# Patient Record
Sex: Female | Born: 1970 | Race: White | Hispanic: No | Marital: Married | Smoking: Current every day smoker
Health system: Southern US, Community
[De-identification: ages and names within clinical notes are randomized; demographics above are authoritative.]

## PROBLEM LIST (undated history)

## (undated) DIAGNOSIS — C50919 Malignant neoplasm of unspecified site of unspecified female breast: Secondary | ICD-10-CM

## (undated) DIAGNOSIS — K52839 Microscopic colitis, unspecified: Secondary | ICD-10-CM

## (undated) DIAGNOSIS — Q2730 Arteriovenous malformation, site unspecified: Secondary | ICD-10-CM

## (undated) DIAGNOSIS — C801 Malignant (primary) neoplasm, unspecified: Secondary | ICD-10-CM

## (undated) HISTORY — PX: CHOLECYSTECTOMY: SHX55

---

## 2011-06-07 HISTORY — PX: CRANIOTOMY FOR AVM: SUR332

## 2011-11-15 DIAGNOSIS — I619 Nontraumatic intracerebral hemorrhage, unspecified: Secondary | ICD-10-CM | POA: Insufficient documentation

## 2011-12-15 DIAGNOSIS — H53469 Homonymous bilateral field defects, unspecified side: Secondary | ICD-10-CM | POA: Insufficient documentation

## 2011-12-15 DIAGNOSIS — Q283 Other malformations of cerebral vessels: Secondary | ICD-10-CM | POA: Insufficient documentation

## 2011-12-15 DIAGNOSIS — H53462 Homonymous bilateral field defects, left side: Secondary | ICD-10-CM | POA: Insufficient documentation

## 2019-09-19 DIAGNOSIS — H53462 Homonymous bilateral field defects, left side: Secondary | ICD-10-CM | POA: Diagnosis not present

## 2019-10-14 DIAGNOSIS — K219 Gastro-esophageal reflux disease without esophagitis: Secondary | ICD-10-CM | POA: Diagnosis not present

## 2019-10-14 DIAGNOSIS — F418 Other specified anxiety disorders: Secondary | ICD-10-CM | POA: Diagnosis not present

## 2019-10-14 DIAGNOSIS — K580 Irritable bowel syndrome with diarrhea: Secondary | ICD-10-CM | POA: Diagnosis not present

## 2019-10-14 DIAGNOSIS — L259 Unspecified contact dermatitis, unspecified cause: Secondary | ICD-10-CM | POA: Diagnosis not present

## 2019-10-14 DIAGNOSIS — F17200 Nicotine dependence, unspecified, uncomplicated: Secondary | ICD-10-CM | POA: Diagnosis not present

## 2019-10-14 DIAGNOSIS — J302 Other seasonal allergic rhinitis: Secondary | ICD-10-CM | POA: Diagnosis not present

## 2019-11-06 DIAGNOSIS — L570 Actinic keratosis: Secondary | ICD-10-CM | POA: Diagnosis not present

## 2019-11-06 DIAGNOSIS — Z85828 Personal history of other malignant neoplasm of skin: Secondary | ICD-10-CM | POA: Diagnosis not present

## 2019-11-06 DIAGNOSIS — L280 Lichen simplex chronicus: Secondary | ICD-10-CM | POA: Diagnosis not present

## 2020-06-19 DIAGNOSIS — K52839 Microscopic colitis, unspecified: Secondary | ICD-10-CM | POA: Diagnosis not present

## 2020-06-19 DIAGNOSIS — Z299 Encounter for prophylactic measures, unspecified: Secondary | ICD-10-CM | POA: Diagnosis not present

## 2020-06-19 DIAGNOSIS — Z6838 Body mass index (BMI) 38.0-38.9, adult: Secondary | ICD-10-CM | POA: Diagnosis not present

## 2020-06-19 DIAGNOSIS — F32A Depression, unspecified: Secondary | ICD-10-CM | POA: Diagnosis not present

## 2020-06-19 DIAGNOSIS — E559 Vitamin D deficiency, unspecified: Secondary | ICD-10-CM | POA: Diagnosis not present

## 2020-06-19 DIAGNOSIS — F17210 Nicotine dependence, cigarettes, uncomplicated: Secondary | ICD-10-CM | POA: Diagnosis not present

## 2020-06-19 DIAGNOSIS — Q282 Arteriovenous malformation of cerebral vessels: Secondary | ICD-10-CM | POA: Diagnosis not present

## 2020-07-27 DIAGNOSIS — Z299 Encounter for prophylactic measures, unspecified: Secondary | ICD-10-CM | POA: Diagnosis not present

## 2020-07-27 DIAGNOSIS — Z1331 Encounter for screening for depression: Secondary | ICD-10-CM | POA: Diagnosis not present

## 2020-07-27 DIAGNOSIS — K649 Unspecified hemorrhoids: Secondary | ICD-10-CM | POA: Diagnosis not present

## 2020-07-27 DIAGNOSIS — Q282 Arteriovenous malformation of cerebral vessels: Secondary | ICD-10-CM | POA: Diagnosis not present

## 2020-07-27 DIAGNOSIS — Z1339 Encounter for screening examination for other mental health and behavioral disorders: Secondary | ICD-10-CM | POA: Diagnosis not present

## 2020-07-27 DIAGNOSIS — Z0000 Encounter for general adult medical examination without abnormal findings: Secondary | ICD-10-CM | POA: Diagnosis not present

## 2020-07-27 DIAGNOSIS — Z7189 Other specified counseling: Secondary | ICD-10-CM | POA: Diagnosis not present

## 2020-09-03 ENCOUNTER — Other Ambulatory Visit: Payer: Self-pay | Admitting: Internal Medicine

## 2020-09-03 DIAGNOSIS — Z1231 Encounter for screening mammogram for malignant neoplasm of breast: Secondary | ICD-10-CM

## 2020-09-07 ENCOUNTER — Ambulatory Visit
Admission: RE | Admit: 2020-09-07 | Discharge: 2020-09-07 | Disposition: A | Discharge status: Home / Self Care | Payer: Medicare HMO | Source: Ambulatory Visit | Attending: Internal Medicine | Admitting: Internal Medicine

## 2020-09-07 ENCOUNTER — Other Ambulatory Visit: Payer: Self-pay

## 2020-09-07 DIAGNOSIS — Z1231 Encounter for screening mammogram for malignant neoplasm of breast: Secondary | ICD-10-CM | POA: Diagnosis not present

## 2020-09-24 DIAGNOSIS — J329 Chronic sinusitis, unspecified: Secondary | ICD-10-CM | POA: Diagnosis not present

## 2020-09-24 DIAGNOSIS — F32A Depression, unspecified: Secondary | ICD-10-CM | POA: Diagnosis not present

## 2020-09-24 DIAGNOSIS — Z299 Encounter for prophylactic measures, unspecified: Secondary | ICD-10-CM | POA: Diagnosis not present

## 2021-01-25 DIAGNOSIS — Z6838 Body mass index (BMI) 38.0-38.9, adult: Secondary | ICD-10-CM | POA: Diagnosis not present

## 2021-01-25 DIAGNOSIS — K649 Unspecified hemorrhoids: Secondary | ICD-10-CM | POA: Diagnosis not present

## 2021-01-25 DIAGNOSIS — F17210 Nicotine dependence, cigarettes, uncomplicated: Secondary | ICD-10-CM | POA: Diagnosis not present

## 2021-01-25 DIAGNOSIS — E559 Vitamin D deficiency, unspecified: Secondary | ICD-10-CM | POA: Diagnosis not present

## 2021-01-25 DIAGNOSIS — F32A Depression, unspecified: Secondary | ICD-10-CM | POA: Diagnosis not present

## 2021-01-25 DIAGNOSIS — Z299 Encounter for prophylactic measures, unspecified: Secondary | ICD-10-CM | POA: Diagnosis not present

## 2021-01-25 DIAGNOSIS — Z789 Other specified health status: Secondary | ICD-10-CM | POA: Diagnosis not present

## 2021-01-25 DIAGNOSIS — E538 Deficiency of other specified B group vitamins: Secondary | ICD-10-CM | POA: Diagnosis not present

## 2021-07-28 DIAGNOSIS — F17210 Nicotine dependence, cigarettes, uncomplicated: Secondary | ICD-10-CM | POA: Diagnosis not present

## 2021-07-28 DIAGNOSIS — Z6837 Body mass index (BMI) 37.0-37.9, adult: Secondary | ICD-10-CM | POA: Diagnosis not present

## 2021-07-28 DIAGNOSIS — E2839 Other primary ovarian failure: Secondary | ICD-10-CM | POA: Diagnosis not present

## 2021-07-28 DIAGNOSIS — Z0000 Encounter for general adult medical examination without abnormal findings: Secondary | ICD-10-CM | POA: Diagnosis not present

## 2021-07-28 DIAGNOSIS — Z1331 Encounter for screening for depression: Secondary | ICD-10-CM | POA: Diagnosis not present

## 2021-07-28 DIAGNOSIS — Z299 Encounter for prophylactic measures, unspecified: Secondary | ICD-10-CM | POA: Diagnosis not present

## 2021-07-28 DIAGNOSIS — F32A Depression, unspecified: Secondary | ICD-10-CM | POA: Diagnosis not present

## 2021-07-28 DIAGNOSIS — Z79899 Other long term (current) drug therapy: Secondary | ICD-10-CM | POA: Diagnosis not present

## 2021-07-28 DIAGNOSIS — Z1339 Encounter for screening examination for other mental health and behavioral disorders: Secondary | ICD-10-CM | POA: Diagnosis not present

## 2021-07-28 DIAGNOSIS — Z7189 Other specified counseling: Secondary | ICD-10-CM | POA: Diagnosis not present

## 2021-07-28 DIAGNOSIS — E559 Vitamin D deficiency, unspecified: Secondary | ICD-10-CM | POA: Diagnosis not present

## 2021-08-06 ENCOUNTER — Other Ambulatory Visit: Payer: Self-pay | Admitting: Internal Medicine

## 2021-08-06 DIAGNOSIS — E2839 Other primary ovarian failure: Secondary | ICD-10-CM | POA: Diagnosis not present

## 2021-08-06 DIAGNOSIS — Z139 Encounter for screening, unspecified: Secondary | ICD-10-CM

## 2021-08-18 ENCOUNTER — Inpatient Hospital Stay: Admission: RE | Admit: 2021-08-18 | Payer: Medicare HMO | Source: Ambulatory Visit

## 2021-09-15 ENCOUNTER — Ambulatory Visit
Admission: RE | Admit: 2021-09-15 | Discharge: 2021-09-15 | Disposition: A | Discharge status: Home / Self Care | Payer: Medicare HMO | Source: Ambulatory Visit | Attending: Internal Medicine | Admitting: Internal Medicine

## 2021-09-15 DIAGNOSIS — Z1231 Encounter for screening mammogram for malignant neoplasm of breast: Secondary | ICD-10-CM | POA: Diagnosis not present

## 2021-09-15 DIAGNOSIS — Z139 Encounter for screening, unspecified: Secondary | ICD-10-CM

## 2021-09-17 ENCOUNTER — Other Ambulatory Visit: Payer: Self-pay | Admitting: Internal Medicine

## 2021-09-17 DIAGNOSIS — R928 Other abnormal and inconclusive findings on diagnostic imaging of breast: Secondary | ICD-10-CM

## 2021-10-20 DIAGNOSIS — N6311 Unspecified lump in the right breast, upper outer quadrant: Secondary | ICD-10-CM | POA: Diagnosis not present

## 2021-10-20 DIAGNOSIS — R928 Other abnormal and inconclusive findings on diagnostic imaging of breast: Secondary | ICD-10-CM | POA: Diagnosis not present

## 2021-10-20 DIAGNOSIS — R921 Mammographic calcification found on diagnostic imaging of breast: Secondary | ICD-10-CM | POA: Diagnosis not present

## 2021-10-20 DIAGNOSIS — D0511 Intraductal carcinoma in situ of right breast: Secondary | ICD-10-CM | POA: Diagnosis not present

## 2021-11-02 DIAGNOSIS — F32A Depression, unspecified: Secondary | ICD-10-CM | POA: Diagnosis not present

## 2021-11-02 DIAGNOSIS — L26 Exfoliative dermatitis: Secondary | ICD-10-CM | POA: Diagnosis not present

## 2021-11-02 DIAGNOSIS — L259 Unspecified contact dermatitis, unspecified cause: Secondary | ICD-10-CM | POA: Diagnosis not present

## 2021-11-02 DIAGNOSIS — F17210 Nicotine dependence, cigarettes, uncomplicated: Secondary | ICD-10-CM | POA: Diagnosis not present

## 2021-11-02 DIAGNOSIS — R21 Rash and other nonspecific skin eruption: Secondary | ICD-10-CM | POA: Diagnosis not present

## 2021-11-02 DIAGNOSIS — Z299 Encounter for prophylactic measures, unspecified: Secondary | ICD-10-CM | POA: Diagnosis not present

## 2021-11-10 ENCOUNTER — Inpatient Hospital Stay (HOSPITAL_COMMUNITY): Payer: Medicare HMO | Attending: Hematology | Admitting: Hematology

## 2021-11-10 DIAGNOSIS — F17210 Nicotine dependence, cigarettes, uncomplicated: Secondary | ICD-10-CM | POA: Insufficient documentation

## 2021-11-10 DIAGNOSIS — D0511 Intraductal carcinoma in situ of right breast: Secondary | ICD-10-CM | POA: Insufficient documentation

## 2021-11-10 DIAGNOSIS — Z79899 Other long term (current) drug therapy: Secondary | ICD-10-CM | POA: Insufficient documentation

## 2021-11-10 DIAGNOSIS — Z1211 Encounter for screening for malignant neoplasm of colon: Secondary | ICD-10-CM | POA: Diagnosis not present

## 2021-11-10 DIAGNOSIS — R634 Abnormal weight loss: Secondary | ICD-10-CM | POA: Insufficient documentation

## 2021-11-10 DIAGNOSIS — Z1212 Encounter for screening for malignant neoplasm of rectum: Secondary | ICD-10-CM | POA: Diagnosis not present

## 2021-11-10 DIAGNOSIS — Z800 Family history of malignant neoplasm of digestive organs: Secondary | ICD-10-CM | POA: Insufficient documentation

## 2021-11-10 DIAGNOSIS — Z803 Family history of malignant neoplasm of breast: Secondary | ICD-10-CM | POA: Diagnosis not present

## 2021-11-10 DIAGNOSIS — E669 Obesity, unspecified: Secondary | ICD-10-CM | POA: Insufficient documentation

## 2021-11-10 DIAGNOSIS — F32A Depression, unspecified: Secondary | ICD-10-CM | POA: Insufficient documentation

## 2021-11-10 NOTE — Progress Notes (Signed)
[facility] [address], [place] [address]   Patient Care Team: Monico Blitz, MD as PCP - General (Internal Medicine) Brien Mates, RN as Oncology Nurse Navigator (Medical Oncology) Derek Jack, MD as Medical Oncologist (Medical Oncology)  CHIEF COMPLAINTS/PURPOSE OF CONSULTATION:  Newly diagnosed right breast DCIS  HISTORY OF PRESENTING ILLNESS:  Katrina Harvey 51 y.o. female is here because of recent diagnosis of right breast DCIS.  Today she reports feeling good. She denies history of breast biopsies. She reports she has menses every 2-3 months starting last year; prior to that they occurred regularly. She denies unintentional weight loss, fevers, and night sweats. She reports she has intentionally losing weight via exercise. She is taking 2000 units of vitamin D daily.   She is not currently working, but previously she worked as a Occupational psychologist. She denies chemical exposure. She currently smokes 1 ppd and she has smoked for about 30 years. Her maternal uncle had cancer of an unknown type, a maternal cousin has lung cancer, and another maternal cousin had breast cancer.   In terms of breast cancer risk profile:  She menarched at early age of 64 and her last menses was 5/31-6/6  She had 2 pregnancies, her first child was born at age 52  She has received birth control pills for approximately 15 years.  She has family history of Breast/GYN/GI cancer  I reviewed her records extensively and collaborated the history with the patient.  SUMMARY OF ONCOLOGIC HISTORY: Oncology History   No history exists.    MEDICAL HISTORY:  No past medical history on file.  SURGICAL HISTORY: No past surgical history on file.  SOCIAL HISTORY: Social History   Socioeconomic History   Marital status: Married    Spouse name: Not on file   Number of children: Not on file   Years of education: Not on file   Highest education level: Not on file  Occupational  History   Not on file  Tobacco Use   Smoking status: Not on file   Smokeless tobacco: Not on file  Substance and Sexual Activity   Alcohol use: Not on file   Drug use: Not on file   Sexual activity: Not on file  Other Topics Concern   Not on file  Social History Narrative   Not on file   Social Determinants of Health   Financial Resource Strain: Not on file  Food Insecurity: Not on file  Transportation Needs: Not on file  Physical Activity: Not on file  Stress: Not on file  Social Connections: Not on file  Intimate Partner Violence: Not on file    FAMILY HISTORY: Family History  Problem Relation Age of Onset   Breast cancer Neg Hx     ALLERGIES:  has no allergies on file.  MEDICATIONS:  Current Outpatient Medications  Medication Sig Dispense Refill   escitalopram (LEXAPRO) 10 MG tablet Take 10 mg by mouth daily.     No current facility-administered medications for this visit.    REVIEW OF SYSTEMS:   Review of Systems  Constitutional:  Negative for appetite change, fatigue, fever and unexpected weight change.  Endocrine: Negative for hot flashes.  Psychiatric/Behavioral:  Positive for depression.   All other systems reviewed and are negative.  PHYSICAL EXAMINATION: ECOG PERFORMANCE STATUS: 0 - Asymptomatic  Vitals:   11/10/21 1316  BP: 130/87  Pulse: (!) 56  Resp: 18  Temp: 97.6 F (36.4 C)  SpO2: 96%   Filed  Weights   11/10/21 1316  Weight: 201 lb 4.5 oz (91.3 kg)   Physical Exam Vitals reviewed.  Constitutional:      Appearance: Normal appearance. She is obese.  Cardiovascular:     Rate and Rhythm: Normal rate and regular rhythm.     Pulses: Normal pulses.     Heart sounds: Normal heart sounds.  Pulmonary:     Effort: Pulmonary effort is normal.     Breath sounds: Normal breath sounds.  Chest:  Breasts:    Right: No swelling, bleeding, inverted nipple, mass, nipple discharge, skin change or tenderness.     Left: No swelling, bleeding,  inverted nipple, mass, nipple discharge, skin change or tenderness.  Abdominal:     Palpations: Abdomen is soft. There is no hepatomegaly, splenomegaly or mass.     Tenderness: There is no abdominal tenderness.  Musculoskeletal:     Right lower leg: No edema.     Left lower leg: No edema.  Lymphadenopathy:     Upper Body:     Right upper body: No supraclavicular or axillary adenopathy.     Left upper body: No supraclavicular or axillary adenopathy.     Lower Body: No right inguinal adenopathy. No left inguinal adenopathy.  Neurological:     General: No focal deficit present.     Mental Status: She is alert and oriented to person, place, and time.  Psychiatric:        Mood and Affect: Mood normal.        Behavior: Behavior normal.    Breast Exam Chaperone: Thana Ates    LABORATORY DATA:  I have reviewed the data as listed No results found for this or any previous visit (from the past 2160 hour(s)).  RADIOGRAPHIC STUDIES: I have personally reviewed the radiological reports and agreed with the findings in the report. No results found.   ASSESSMENT:  Right breast DCIS, ER positive: - Mammogram (09/16/2021): BI-RADS Category 0 with possible asymmetry with calcifications in the right breast - Diagnostic mammogram and ultrasound (10/20/2021): 1.7 cm mass with associated calcifications in the right breast at 11 o'clock position - Biopsy right breast 11:00 (10/20/2021): DCIS, moderate to high nuclear grade, solid to cribriform pattern, with comedonecrosis and calcification.  Tumor involves/4 biopsy specimens.  Invasive carcinoma not identified.  ER: 70% positive, PR 2% positive   Social/family history: - She is currently on disability.  She used to work as a Occupational psychologist.  Current active smoker, 1 pack/day for 30 years.  No chemical exposure. - Maternal uncle had cancer, type unknown to the patient.  Maternal cousin has lung cancer and another maternal cousin has breast cancer. - LMP  on 11/03/2021.  Menses have gotten irregular once every 2 to 3 months since 2022.   PLAN:  Right breast DCIS, ER positive: - We have reviewed imaging studies and pathology report in detail. - I have recommended lumpectomy.  I have discussed the role of radiation therapy and antiestrogen therapy with tamoxifen. - I will see her back 4 weeks after surgical resection.  2.  Bone health: - She was on weekly high-dose vitamin D until 2 months ago and is currently taking vitamin D 2000 units daily. - We will obtain DEXA scan reports from Dr. Trena Platt office. - We will also check vitamin D level.    Derek Jack, MD 11/10/21 2:13 PM  [facility] [phone]   I, Thana Ates, am acting as a scribe for Dr. Derek Jack.  I,  Derek Jack MD, have reviewed the above documentation for accuracy and completeness, and I agree with the above.

## 2021-11-10 NOTE — Patient Instructions (Addendum)
[place] at [facility] Discharge Instructions  You were seen and examined today by Dr. Delton Coombes. Dr. Delton Coombes is a medical oncologist, meaning that he specializes in the treatment of cancer diagnoses. Dr. Delton Coombes discussed your past medical history, family history of cancers, and the events that led to you being here today.  You were referred to Dr. Delton Coombes by [facility] due to an abnormality on your recent mammogram leading to a biopsy.  The biopsy results revealed estrogen and progesterone receptor positive ductal carcinoma in situ (DCIS). This is a pre-cancerous condition that is being fed by the hormones that occur naturally within your body.  Please see a surgeon regarding a lumpectomy. You will be referred to see a Psychologist, sport and exercise.  Following surgery, the goal will be to prevent DCIS from returning, or from returning as invasive breast cancer. This is done with radiation as well as a daily anti-estrogen pill to be taken for at least 5 years.  Because of the need for anti-estrogen pills, Dr. Delton Coombes has recommended a bone density scan and checking your vitamin D.  Follow-up with Dr. Delton Coombes approximately 4 weeks after surgery.   Thank you for choosing [place] at [facility] to provide your oncology and hematology care.  To afford each patient quality time with our provider, please arrive at least 15 minutes before your scheduled appointment time.   If you have a lab appointment with the [facility] please come in thru the Main Entrance and check in at the main information desk.  You need to re-schedule your appointment should you arrive 10 or more minutes late.  We strive to give you quality time with our providers, and arriving late affects you and other patients whose appointments are after yours.  Also, if you no show three or more times for appointments you may be dismissed from the clinic at the providers discretion.      Again, thank you for choosing [facility].  Our hope is that these requests will decrease the amount of time that you wait before being seen by our physicians.       _____________________________________________________________  Should you have questions after your visit to [facility], please contact our office at [phone] and follow the prompts.  Our office hours are 8:00 a.m. and 4:30 p.m. Monday - Friday.  Please note that voicemails left after 4:00 p.m. may not be returned until the following business day.  We are closed weekends and major holidays.  You do have access to a nurse 24-7, just call the main number to the clinic [phone] and do not press any options, hold on the line and a nurse will answer the phone.    For prescription refill requests, have your pharmacy contact our office and allow 72 hours.    Due to Covid, you will need to wear a mask upon entering the hospital. If you do not have a mask, a mask will be given to you at the Main Entrance upon arrival. For doctor visits, patients may have 1 support person age 55 or older with them. For treatment visits, patients can not have anyone with them due to social distancing guidelines and our immunocompromised population.

## 2021-11-18 LAB — COLOGUARD: COLOGUARD: NEGATIVE

## 2021-11-19 DIAGNOSIS — F17210 Nicotine dependence, cigarettes, uncomplicated: Secondary | ICD-10-CM | POA: Diagnosis not present

## 2021-11-19 DIAGNOSIS — L237 Allergic contact dermatitis due to plants, except food: Secondary | ICD-10-CM | POA: Diagnosis not present

## 2021-11-19 DIAGNOSIS — C50911 Malignant neoplasm of unspecified site of right female breast: Secondary | ICD-10-CM | POA: Diagnosis not present

## 2021-11-19 DIAGNOSIS — Z299 Encounter for prophylactic measures, unspecified: Secondary | ICD-10-CM | POA: Diagnosis not present

## 2021-11-25 ENCOUNTER — Ambulatory Visit: Payer: Medicare HMO | Admitting: General Surgery

## 2021-11-25 ENCOUNTER — Encounter: Payer: Self-pay | Admitting: General Surgery

## 2021-11-25 VITALS — BP 116/75 | HR 63 | Temp 98.1°F | Resp 12 | Ht 63.0 in | Wt 201.0 lb

## 2021-11-25 DIAGNOSIS — D0511 Intraductal carcinoma in situ of right breast: Secondary | ICD-10-CM | POA: Diagnosis not present

## 2021-11-25 NOTE — Progress Notes (Signed)
Katrina Harvey; [id number]; 06/27/1970   HPI Patient is a 51-year-old white female who was referred to my care by Dr. Katragadda of oncology for evaluation and treatment of DCIS of the right breast.  This was found on routine screening mammography.  This is biopsy-proven to be DCIS with ER/PR positivity.  Patient did not feel a lump.  She has had no bloody nipple discharge.  There is no family history of breast cancer. History reviewed. No pertinent past medical history.  History reviewed. No pertinent surgical history.  Family History  Problem Relation Age of Onset   Breast cancer Neg Hx     Current Outpatient Medications on File Prior to Visit  Medication Sig Dispense Refill   Cholecalciferol (VITAMIN D) 50 MCG (2000 UT) tablet Take 2,000 Units by mouth daily.     escitalopram (LEXAPRO) 10 MG tablet Take 10 mg by mouth daily.     No current facility-administered medications on file prior to visit.    No Known Allergies  Social History   Substance and Sexual Activity  Alcohol Use Not Currently    Social History   Tobacco Use  Smoking Status Every Day   Types: Cigarettes  Smokeless Tobacco Never    Review of Systems  HENT: Negative.    Eyes:  Positive for blurred vision.  Respiratory: Negative.    Cardiovascular: Negative.   Gastrointestinal: Negative.   Genitourinary: Negative.   Musculoskeletal:  Positive for back pain and joint pain.  Skin:  Positive for rash.  Neurological: Negative.   Endo/Heme/Allergies: Negative.   Psychiatric/Behavioral: Negative.      Objective   Vitals:   11/25/21 0905  BP: 116/75  Pulse: 63  Resp: 12  Temp: 98.1 F (36.7 C)  SpO2: 96%    Physical Exam Vitals reviewed. Exam conducted with a chaperone present.  Constitutional:      Appearance: Normal appearance. She is not ill-appearing.  HENT:     Head: Normocephalic and atraumatic.  Cardiovascular:     Rate and Rhythm: Normal rate and regular rhythm.     Heart sounds:  Normal heart sounds. No murmur heard.    No friction rub. No gallop.  Pulmonary:     Effort: Pulmonary effort is normal. No respiratory distress.     Breath sounds: Normal breath sounds. No stridor. No wheezing, rhonchi or rales.  Musculoskeletal:     Cervical back: Normal range of motion and neck supple.  Lymphadenopathy:     Cervical: No cervical adenopathy.  Skin:    General: Skin is warm and dry.  Neurological:     Mental Status: She is alert and oriented to person, place, and time.   Breast: An indistinct area of increased density is noted in the upper, outer quadrant of the right breast.  No ecchymosis present.  No nipple discharge or dimpling is noted.  The axilla is negative for palpable nodes.  Left breast examination reveals no dominant mass, nipple discharge, or dimpling.  The axilla is negative for palpable nodes.  Dr. Katragadda's notes reviewed.  Mammogram report reviewed.  Assessment  DCIS of right breast, 1.7 cm in greatest diameter.  ER/PR positive. Plan  Patient has already decided to undergo breast conservative therapy.  She understands that she will need radiation therapy after the procedure.  We will proceed with right partial mastectomy after radiofrequency tag placement, sentinel lymph node biopsy.  The risks and benefits of the procedures including bleeding, infection, and incomplete margins were fully explained to the patient,   who gave informed consent.  We are arranging scheduling of the procedure. 

## 2021-11-29 ENCOUNTER — Other Ambulatory Visit (HOSPITAL_COMMUNITY): Payer: Self-pay | Admitting: General Surgery

## 2021-11-29 DIAGNOSIS — D0511 Intraductal carcinoma in situ of right breast: Secondary | ICD-10-CM

## 2021-11-29 DIAGNOSIS — D0512 Intraductal carcinoma in situ of left breast: Secondary | ICD-10-CM

## 2021-12-02 ENCOUNTER — Inpatient Hospital Stay
Admission: RE | Admit: 2021-12-02 | Discharge: 2021-12-02 | Disposition: A | Discharge status: Home / Self Care | Payer: Self-pay | Source: Ambulatory Visit | Attending: *Deleted | Admitting: *Deleted

## 2021-12-02 ENCOUNTER — Other Ambulatory Visit: Payer: Self-pay | Admitting: *Deleted

## 2021-12-02 DIAGNOSIS — Z1231 Encounter for screening mammogram for malignant neoplasm of breast: Secondary | ICD-10-CM

## 2021-12-08 NOTE — H&P (Signed)
Katrina Harvey; [id number]; 07-05-1970   HPI Patient is a 51 year old white female who was referred to my care by Dr. 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  This was found on routine screening mammography.  This is biopsy-proven to be DCIS with ER/PR positivity.  Patient did not feel a lump.  She has had no bloody nipple discharge.  There is no family history of breast cancer. History reviewed. No pertinent past medical history.  History reviewed. No pertinent surgical history.  Family History  Problem Relation Age of Onset   Breast cancer Neg Hx     Current Outpatient Medications on File Prior to Visit  Medication Sig Dispense Refill   Cholecalciferol (VITAMIN D) 50 MCG (2000 UT) tablet Take 2,000 Units by mouth daily.     escitalopram (LEXAPRO) 10 MG tablet Take 10 mg by mouth daily.     No current facility-administered medications on file prior to visit.    No Known Allergies  Social History   Substance and Sexual Activity  Alcohol Use Not Currently    Social History   Tobacco Use  Smoking Status Every Day   Types: Cigarettes  Smokeless Tobacco Never    Review of Systems  HENT: Negative.    Eyes:  Positive for blurred vision.  Respiratory: Negative.    Cardiovascular: Negative.   Gastrointestinal: Negative.   Genitourinary: Negative.   Musculoskeletal:  Positive for back pain and joint pain.  Skin:  Positive for rash.  Neurological: Negative.   Endo/Heme/Allergies: Negative.   Psychiatric/Behavioral: Negative.      Objective   Vitals:   11/25/21 0905  BP: 116/75  Pulse: 63  Resp: 12  Temp: 98.1 F (36.7 C)  SpO2: 96%    Physical Exam Vitals reviewed. Exam conducted with a chaperone present.  Constitutional:      Appearance: Normal appearance. She is not ill-appearing.  HENT:     Head: Normocephalic and atraumatic.  Cardiovascular:     Rate and Rhythm: Normal rate and regular rhythm.     Heart sounds:  Normal heart sounds. No murmur heard.    No friction rub. No gallop.  Pulmonary:     Effort: Pulmonary effort is normal. No respiratory distress.     Breath sounds: Normal breath sounds. No stridor. No wheezing, rhonchi or rales.  Musculoskeletal:     Cervical back: Normal range of motion and neck supple.  Lymphadenopathy:     Cervical: No cervical adenopathy.  Skin:    General: Skin is warm and dry.  Neurological:     Mental Status: She is alert and oriented to person, place, and time.   Breast: An indistinct area of increased density is noted in the upper, outer quadrant of the right breast.  No ecchymosis present.  No nipple discharge or dimpling is noted.  The axilla is negative for palpable nodes.  Left breast examination reveals no dominant mass, nipple discharge, or dimpling.  The axilla is negative for palpable nodes.  Dr. Tomie China notes reviewed.  Mammogram report reviewed.  Assessment  DCIS of right breast, 1.7 cm in greatest diameter.  ER/PR positive. Plan  Patient has already decided to undergo breast conservative therapy.  She understands that she will need radiation therapy after the procedure.  We will proceed with right partial mastectomy after radiofrequency tag placement, sentinel lymph node biopsy.  The risks and benefits of the procedures including bleeding, infection, and incomplete margins were fully explained to the patient,  who gave informed consent.  We are arranging scheduling of the procedure.

## 2021-12-14 ENCOUNTER — Other Ambulatory Visit (HOSPITAL_COMMUNITY): Payer: Self-pay | Admitting: General Surgery

## 2021-12-14 ENCOUNTER — Ambulatory Visit (HOSPITAL_COMMUNITY)
Admission: RE | Admit: 2021-12-14 | Discharge: 2021-12-14 | Disposition: A | Discharge status: Home / Self Care | Payer: Medicare HMO | Source: Ambulatory Visit | Attending: General Surgery | Admitting: General Surgery

## 2021-12-14 ENCOUNTER — Inpatient Hospital Stay (HOSPITAL_COMMUNITY): Payer: Medicare HMO | Attending: Hematology

## 2021-12-14 DIAGNOSIS — D0511 Intraductal carcinoma in situ of right breast: Secondary | ICD-10-CM

## 2021-12-14 DIAGNOSIS — D0512 Intraductal carcinoma in situ of left breast: Secondary | ICD-10-CM | POA: Diagnosis not present

## 2021-12-14 DIAGNOSIS — E559 Vitamin D deficiency, unspecified: Secondary | ICD-10-CM | POA: Diagnosis not present

## 2021-12-14 DIAGNOSIS — R921 Mammographic calcification found on diagnostic imaging of breast: Secondary | ICD-10-CM | POA: Diagnosis not present

## 2021-12-14 LAB — COMPREHENSIVE METABOLIC PANEL
ALT: 18 U/L (ref 0–44)
AST: 21 U/L (ref 15–41)
Albumin: 4.3 g/dL (ref 3.5–5.0)
Alkaline Phosphatase: 103 U/L (ref 38–126)
Anion gap: 5 (ref 5–15)
BUN: 14 mg/dL (ref 6–20)
CO2: 28 mmol/L (ref 22–32)
Calcium: 9.3 mg/dL (ref 8.9–10.3)
Chloride: 105 mmol/L (ref 98–111)
Creatinine, Ser: 0.69 mg/dL (ref 0.44–1.00)
GFR, Estimated: >60 mL/min (ref >60)
Glucose, Bld: 106 mg/dL — ABNORMAL HIGH (ref 70–99)
Potassium: 4.1 mmol/L (ref 3.5–5.1)
Sodium: 138 mmol/L (ref 135–145)
Total Bilirubin: 0.8 mg/dL (ref 0.3–1.2)
Total Protein: 8 g/dL (ref 6.5–8.1)

## 2021-12-14 LAB — VITAMIN D 25 HYDROXY (VIT D DEFICIENCY, FRACTURES): Vit D, 25-Hydroxy: 24.47 ng/mL — ABNORMAL LOW (ref 30–100)

## 2021-12-14 LAB — CBC WITH DIFFERENTIAL/PLATELET
Abs Immature Granulocytes: 0.02 10*3/uL (ref 0.00–0.07)
Basophils Absolute: 0.1 10*3/uL (ref 0.0–0.1)
Basophils Relative: 1 %
Eosinophils Absolute: 0.1 10*3/uL (ref 0.0–0.5)
Eosinophils Relative: 1 %
HCT: 46.4 % — ABNORMAL HIGH (ref 36.0–46.0)
Hemoglobin: 15 g/dL (ref 12.0–15.0)
Immature Granulocytes: 0 %
Lymphocytes Relative: 27 %
Lymphs Abs: 2 10*3/uL (ref 0.7–4.0)
MCH: 28.7 pg (ref 26.0–34.0)
MCHC: 32.3 g/dL (ref 30.0–36.0)
MCV: 88.7 fL (ref 80.0–100.0)
Monocytes Absolute: 0.6 10*3/uL (ref 0.1–1.0)
Monocytes Relative: 8 %
Neutro Abs: 4.6 10*3/uL (ref 1.7–7.7)
Neutrophils Relative %: 63 %
Platelets: 237 10*3/uL (ref 150–400)
RBC: 5.23 MIL/uL — ABNORMAL HIGH (ref 3.87–5.11)
RDW: 12.9 % (ref 11.5–15.5)
WBC: 7.3 10*3/uL (ref 4.0–10.5)
nRBC: 0 % (ref 0.0–0.2)

## 2021-12-14 MED ORDER — LIDOCAINE-EPINEPHRINE (PF) 1 %-1:200000 IJ SOLN
INTRAMUSCULAR | Status: AC
  Filled 2021-12-14: qty 30

## 2021-12-14 MED ORDER — LIDOCAINE HCL (PF) 2 % IJ SOLN
INTRAMUSCULAR | Status: AC
  Filled 2021-12-14: qty 10

## 2021-12-20 NOTE — Patient Instructions (Signed)
Katrina Harvey  12/20/2021     '@PREFPERIOPPHARMACY'$ @   Your procedure is scheduled on  12/24/2021.   Report to [facility] at  0730  A.M.   Call this number if you have problems the morning of surgery:  [phone]   Remember:  Do not eat or drink after midnight.      Take these medicines the morning of surgery with A SIP OF WATER                                        lexapro.     Do not wear jewelry, make-up or nail polish.  Do not wear lotions, powders, or perfumes, or deodorant.  Do not shave 48 hours prior to surgery.  Men may shave face and neck.  Do not bring valuables to the hospital.  [facility] is not responsible for any belongings or valuables.  Contacts, dentures or bridgework may not be worn into surgery.  Leave your suitcase in the car.  After surgery it may be brought to your room.  For patients admitted to the hospital, discharge time will be determined by your treatment team.  Patients discharged the day of surgery will not be allowed to drive home and must have someone with them for 24 hours.    Special instructions:   DO NOT smoke tobacco or vape for 24 hours before your procedure.  Please read over the following fact sheets that you were given. Coughing and Deep Breathing, Surgical Site Infection Prevention, Anesthesia Post-op Instructions, and Care and Recovery After Surgery      Sentinel Lymph Node Biopsy in Breast Cancer Treatment, Care After The following information offers guidance on how to care for yourself after your procedure. Your health care provider may also give you more specific instructions. If you have problems or questions, contact your health care provider. What can I expect after the procedure? After the procedure, it is common to have: Blue urine and darker stool for the next 24 hours. This is caused by the dye that was used during the procedure and is normal. Blue skin at the injection site. This may last for up to 8  weeks. Numbness, tingling, or pain near your incision. Swelling or bruising near your incision. Follow these instructions at home: Activity Avoid activities that take a lot of effort. Return to your normal activities as told by your health care provider. Ask your health care provider what activities are safe for you. Incision care  Follow instructions from your health care provider about how to take care of your incision. Make sure you: Wash your hands with soap and water for at least 20 seconds before and after you change your bandage (dressing). If soap and water are not available, use hand sanitizer. Change your dressing as told by your health care provider. Leave stitches (sutures), metal clips, skin glue, or adhesive strips in place. These skin closures may need to stay in place for 2 weeks or longer. If adhesive strip edges start to loosen and curl up, you may trim the loose edges. Do not remove adhesive strips completely unless your health care provider tells you to do that. Do not take baths, swim, or use a hot tub until your health care provider approves. Ask your health care provider if you may take showers. You may be able to shower 24  hours after your procedure. Check your biopsy site every day for signs of infection. Check for: More redness, swelling, or pain. Fluid or blood. Warmth. Pus or a bad smell. General instructions If you were given a surgical bra, wear it for the next 48 hours. You may remove the bra to shower. Take over-the-counter and prescription medicines only as told by your health care provider. You may resume your regular diet. Do not have your blood pressure taken or have blood drawn from the arm on the side of the biopsy until your health care provider says it is okay. You may need to be screened for extra fluid around the lymph nodes and swelling in the breast and arm (lymphedema). Follow instructions from your health care provider about how often you should  be checked. Keep all follow-up visits. This is important. Contact a health care provider if you have: Nausea and vomiting. Any of these signs of infection: More redness, swelling, or pain around your biopsy site. Fluid or blood coming from your incision. Warmth coming from your incision area. Pus or a bad smell coming from your incision. Any new bruising. Chills or a fever. Get help right away if you have: Pain that is getting worse and your medicine is not helping. Vomiting that will not stop. Chest pain or trouble breathing. These symptoms may be an emergency. Get help right away. Call 911. Do not wait to see if the symptoms will go away. Do not drive yourself to the hospital. Summary After the procedure, it is common to have blue urine and darker stool for the next 24 hours. This is normal. It is caused by the dye that was used during the procedure. Follow instructions from your health care provider about how to take care of your incision. Do not have your blood pressure taken or have blood drawn from the arm on the side of the biopsy until your health care provider says it is okay. You may need to be screened for extra fluid around the lymph nodes and swelling in the breast and arm (lymphedema). Follow instructions from your health care provider about how often you should be checked. This information is not intended to replace advice given to you by your health care provider. Make sure you discuss any questions you have with your health care provider. Document Revised: 05/05/2021 Document Reviewed: 05/05/2021 Elsevier Patient Education  [place] Anesthesia, Adult, Care After This sheet gives you information about how to care for yourself after your procedure. Your health care provider may also give you more specific instructions. If you have problems or questions, contact your health care provider. What can I expect after the procedure? After the procedure, the  following side effects are common: Pain or discomfort at the IV site. Nausea. Vomiting. Sore throat. Trouble concentrating. Feeling cold or chills. Feeling weak or tired. Sleepiness and fatigue. Soreness and body aches. These side effects can affect parts of the body that were not involved in surgery. Follow these instructions at home: For the time period you were told by your health care provider:  Rest. Do not participate in activities where you could fall or become injured. Do not drive or use machinery. Do not drink alcohol. Do not take sleeping pills or medicines that cause drowsiness. Do not make important decisions or sign legal documents. Do not take care of children on your own. Eating and drinking Follow any instructions from your health care provider about eating or drinking restrictions. When you  feel hungry, start by eating small amounts of foods that are soft and easy to digest (bland), such as toast. Gradually return to your regular diet. Drink enough fluid to keep your urine pale yellow. If you vomit, rehydrate by drinking water, juice, or clear broth. General instructions If you have sleep apnea, surgery and certain medicines can increase your risk for breathing problems. Follow instructions from your health care provider about wearing your sleep device: Anytime you are sleeping, including during daytime naps. While taking prescription pain medicines, sleeping medicines, or medicines that make you drowsy. Have a responsible adult stay with you for the time you are told. It is important to have someone help care for you until you are awake and alert. Return to your normal activities as told by your health care provider. Ask your health care provider what activities are safe for you. Take over-the-counter and prescription medicines only as told by your health care provider. If you smoke, do not smoke without supervision. Keep all follow-up visits as told by your health  care provider. This is important. Contact a health care provider if: You have nausea or vomiting that does not get better with medicine. You cannot eat or drink without vomiting. You have pain that does not get better with medicine. You are unable to pass urine. You develop a skin rash. You have a fever. You have redness around your IV site that gets worse. Get help right away if: You have difficulty breathing. You have chest pain. You have blood in your urine or stool, or you vomit blood. Summary After the procedure, it is common to have a sore throat or nausea. It is also common to feel tired. Have a responsible adult stay with you for the time you are told. It is important to have someone help care for you until you are awake and alert. When you feel hungry, start by eating small amounts of foods that are soft and easy to digest (bland), such as toast. Gradually return to your regular diet. Drink enough fluid to keep your urine pale yellow. Return to your normal activities as told by your health care provider. Ask your health care provider what activities are safe for you. This information is not intended to replace advice given to you by your health care provider. Make sure you discuss any questions you have with your health care provider. Document Revised: 02/06/2020 Document Reviewed: 09/05/2019 Elsevier Patient Education  [place]. How to Use Chlorhexidine for Bathing Chlorhexidine gluconate (CHG) is a germ-killing (antiseptic) solution that is used to clean the skin. It can get rid of the bacteria that normally live on the skin and can keep them away for about 24 hours. To clean your skin with CHG, you may be given: A CHG solution to use in the shower or as part of a sponge bath. A prepackaged cloth that contains CHG. Cleaning your skin with CHG may help lower the risk for infection: While you are staying in the intensive care unit of the hospital. If you have a vascular  access, such as a central line, to provide short-term or long-term access to your veins. If you have a catheter to drain urine from your bladder. If you are on a ventilator. A ventilator is a machine that helps you breathe by moving air in and out of your lungs. After surgery. What are the risks? Risks of using CHG include: A skin reaction. Hearing loss, if CHG gets in your ears and you  have a perforated eardrum. Eye injury, if CHG gets in your eyes and is not rinsed out. The CHG product catching fire. Make sure that you avoid smoking and flames after applying CHG to your skin. Do not use CHG: If you have a chlorhexidine allergy or have previously reacted to chlorhexidine. On babies younger than 75 months of age. How to use CHG solution Use CHG only as told by your health care provider, and follow the instructions on the label. Use the full amount of CHG as directed. Usually, this is one bottle. During a shower Follow these steps when using CHG solution during a shower (unless your health care provider gives you different instructions): Start the shower. Use your normal soap and shampoo to wash your face and hair. Turn off the shower or move out of the shower stream. Pour the CHG onto a clean washcloth. Do not use any type of brush or rough-edged sponge. Starting at your neck, lather your body down to your toes. Make sure you follow these instructions: If you will be having surgery, pay special attention to the part of your body where you will be having surgery. Scrub this area for at least 1 minute. Do not use CHG on your head or face. If the solution gets into your ears or eyes, rinse them well with water. Avoid your genital area. Avoid any areas of skin that have broken skin, cuts, or scrapes. Scrub your back and under your arms. Make sure to wash skin folds. Let the lather sit on your skin for 1-2 minutes or as long as told by your health care provider. Thoroughly rinse your entire  body in the shower. Make sure that all body creases and crevices are rinsed well. Dry off with a clean towel. Do not put any substances on your body afterward--such as powder, lotion, or perfume--unless you are told to do so by your health care provider. Only use lotions that are recommended by the manufacturer. Put on clean clothes or pajamas. If it is the night before your surgery, sleep in clean sheets.  During a sponge bath Follow these steps when using CHG solution during a sponge bath (unless your health care provider gives you different instructions): Use your normal soap and shampoo to wash your face and hair. Pour the CHG onto a clean washcloth. Starting at your neck, lather your body down to your toes. Make sure you follow these instructions: If you will be having surgery, pay special attention to the part of your body where you will be having surgery. Scrub this area for at least 1 minute. Do not use CHG on your head or face. If the solution gets into your ears or eyes, rinse them well with water. Avoid your genital area. Avoid any areas of skin that have broken skin, cuts, or scrapes. Scrub your back and under your arms. Make sure to wash skin folds. Let the lather sit on your skin for 1-2 minutes or as long as told by your health care provider. Using a different clean, wet washcloth, thoroughly rinse your entire body. Make sure that all body creases and crevices are rinsed well. Dry off with a clean towel. Do not put any substances on your body afterward--such as powder, lotion, or perfume--unless you are told to do so by your health care provider. Only use lotions that are recommended by the manufacturer. Put on clean clothes or pajamas. If it is the night before your surgery, sleep in clean sheets. How to  use CHG prepackaged cloths Only use CHG cloths as told by your health care provider, and follow the instructions on the label. Use the CHG cloth on clean, dry skin. Do not use  the CHG cloth on your head or face unless your health care provider tells you to. When washing with the CHG cloth: Avoid your genital area. Avoid any areas of skin that have broken skin, cuts, or scrapes. Before surgery Follow these steps when using a CHG cloth to clean before surgery (unless your health care provider gives you different instructions): Using the CHG cloth, vigorously scrub the part of your body where you will be having surgery. Scrub using a back-and-forth motion for 3 minutes. The area on your body should be completely wet with CHG when you are done scrubbing. Do not rinse. Discard the cloth and let the area air-dry. Do not put any substances on the area afterward, such as powder, lotion, or perfume. Put on clean clothes or pajamas. If it is the night before your surgery, sleep in clean sheets.  For general bathing Follow these steps when using CHG cloths for general bathing (unless your health care provider gives you different instructions). Use a separate CHG cloth for each area of your body. Make sure you wash between any folds of skin and between your fingers and toes. Wash your body in the following order, switching to a new cloth after each step: The front of your neck, shoulders, and chest. Both of your arms, under your arms, and your hands. Your stomach and groin area, avoiding the genitals. Your right leg and foot. Your left leg and foot. The back of your neck, your back, and your buttocks. Do not rinse. Discard the cloth and let the area air-dry. Do not put any substances on your body afterward--such as powder, lotion, or perfume--unless you are told to do so by your health care provider. Only use lotions that are recommended by the manufacturer. Put on clean clothes or pajamas. Contact a health care provider if: Your skin gets irritated after scrubbing. You have questions about using your solution or cloth. You swallow any chlorhexidine. Call your local poison  control center (1-(303) 818-9436 in the [place] Get help right away if: Your eyes itch badly, or they become very red or swollen. Your skin itches badly and is red or swollen. Your hearing changes. You have trouble seeing. You have swelling or tingling in your mouth or throat. You have trouble breathing. These symptoms may represent a serious problem that is an emergency. Do not wait to see if the symptoms will go away. Get medical help right away. Call your local emergency services (911 in the [place] Do not drive yourself to the hospital. Summary Chlorhexidine gluconate (CHG) is a germ-killing (antiseptic) solution that is used to clean the skin. Cleaning your skin with CHG may help to lower your risk for infection. You may be given CHG to use for bathing. It may be in a bottle or in a prepackaged cloth to use on your skin. Carefully follow your health care provider's instructions and the instructions on the product label. Do not use CHG if you have a chlorhexidine allergy. Contact your health care provider if your skin gets irritated after scrubbing. This information is not intended to replace advice given to you by your health care provider. Make sure you discuss any questions you have with your health care provider. Document Revised: 08/03/2020 Document Reviewed: 08/03/2020 Elsevier Patient Education  [place].

## 2021-12-21 ENCOUNTER — Encounter (HOSPITAL_COMMUNITY)
Admission: RE | Admit: 2021-12-21 | Discharge: 2021-12-21 | Disposition: A | Discharge status: Home / Self Care | Payer: Medicare HMO | Source: Ambulatory Visit | Attending: General Surgery | Admitting: General Surgery

## 2021-12-21 ENCOUNTER — Ambulatory Visit (HOSPITAL_COMMUNITY)
Admission: RE | Admit: 2021-12-21 | Discharge: 2021-12-21 | Disposition: A | Discharge status: Home / Self Care | Payer: Medicare HMO | Source: Ambulatory Visit | Attending: General Surgery | Admitting: General Surgery

## 2021-12-21 ENCOUNTER — Other Ambulatory Visit: Payer: Self-pay

## 2021-12-21 ENCOUNTER — Encounter (HOSPITAL_COMMUNITY): Payer: Self-pay

## 2021-12-21 VITALS — BP 134/73 | HR 66 | Temp 98.7°F | Resp 18 | Ht 63.0 in | Wt 201.1 lb

## 2021-12-21 DIAGNOSIS — D0511 Intraductal carcinoma in situ of right breast: Secondary | ICD-10-CM | POA: Insufficient documentation

## 2021-12-21 DIAGNOSIS — F17200 Nicotine dependence, unspecified, uncomplicated: Secondary | ICD-10-CM | POA: Diagnosis not present

## 2021-12-21 DIAGNOSIS — Z01818 Encounter for other preprocedural examination: Secondary | ICD-10-CM | POA: Insufficient documentation

## 2021-12-21 DIAGNOSIS — M47814 Spondylosis without myelopathy or radiculopathy, thoracic region: Secondary | ICD-10-CM | POA: Diagnosis not present

## 2021-12-21 LAB — POCT PREGNANCY, URINE: Preg Test, Ur: NEGATIVE

## 2021-12-24 ENCOUNTER — Other Ambulatory Visit: Payer: Self-pay

## 2021-12-24 ENCOUNTER — Encounter (HOSPITAL_COMMUNITY)
Admission: RE | Disposition: A | Discharge status: Home / Self Care | Payer: Self-pay | Source: Home / Self Care | Attending: General Surgery

## 2021-12-24 ENCOUNTER — Ambulatory Visit (HOSPITAL_COMMUNITY): Payer: Medicare HMO | Admitting: Anesthesiology

## 2021-12-24 ENCOUNTER — Ambulatory Visit (HOSPITAL_COMMUNITY)
Admission: RE | Admit: 2021-12-24 | Discharge: 2021-12-24 | Disposition: A | Discharge status: Home / Self Care | Payer: Medicare HMO | Attending: General Surgery | Admitting: General Surgery

## 2021-12-24 ENCOUNTER — Ambulatory Visit (HOSPITAL_COMMUNITY)
Admission: RE | Admit: 2021-12-24 | Discharge: 2021-12-24 | Disposition: A | Discharge status: Home / Self Care | Payer: Medicare HMO | Source: Ambulatory Visit | Attending: General Surgery | Admitting: General Surgery

## 2021-12-24 ENCOUNTER — Ambulatory Visit (HOSPITAL_COMMUNITY): Payer: Medicare HMO

## 2021-12-24 ENCOUNTER — Ambulatory Visit (HOSPITAL_BASED_OUTPATIENT_CLINIC_OR_DEPARTMENT_OTHER): Payer: Medicare HMO | Admitting: Anesthesiology

## 2021-12-24 ENCOUNTER — Encounter (HOSPITAL_COMMUNITY): Payer: Self-pay | Admitting: General Surgery

## 2021-12-24 DIAGNOSIS — D0511 Intraductal carcinoma in situ of right breast: Secondary | ICD-10-CM

## 2021-12-24 DIAGNOSIS — F17200 Nicotine dependence, unspecified, uncomplicated: Secondary | ICD-10-CM | POA: Insufficient documentation

## 2021-12-24 DIAGNOSIS — Z170 Estrogen receptor positive status [ER+]: Secondary | ICD-10-CM | POA: Diagnosis not present

## 2021-12-24 DIAGNOSIS — R928 Other abnormal and inconclusive findings on diagnostic imaging of breast: Secondary | ICD-10-CM

## 2021-12-24 DIAGNOSIS — N641 Fat necrosis of breast: Secondary | ICD-10-CM | POA: Diagnosis not present

## 2021-12-24 HISTORY — PX: PARTIAL MASTECTOMY WITH AXILLARY SENTINEL LYMPH NODE BIOPSY: SHX6004

## 2021-12-24 SURGERY — PARTIAL MASTECTOMY WITH AXILLARY SENTINEL LYMPH NODE BIOPSY
Anesthesia: General | Laterality: Right

## 2021-12-24 MED ORDER — PROPOFOL 10 MG/ML IV BOLUS
INTRAVENOUS | Status: AC
  Filled 2021-12-24: qty 20

## 2021-12-24 MED ORDER — ONDANSETRON HCL 4 MG/2ML IJ SOLN: 4.0000 mg | Freq: Once | INTRAMUSCULAR | Status: DC | PRN

## 2021-12-24 MED ORDER — SUGAMMADEX SODIUM 200 MG/2ML IV SOLN
INTRAVENOUS | Status: DC | PRN
  Administered 2021-12-24: 200 mg via INTRAVENOUS

## 2021-12-24 MED ORDER — HYDROCODONE-ACETAMINOPHEN 5-325 MG PO TABS: 1.0000 | ORAL_TABLET | ORAL | 0 refills | Status: DC | PRN

## 2021-12-24 MED ORDER — MIDAZOLAM HCL 2 MG/2ML IJ SOLN
INTRAMUSCULAR | Status: AC
  Filled 2021-12-24: qty 2

## 2021-12-24 MED ORDER — ROCURONIUM BROMIDE 100 MG/10ML IV SOLN
INTRAVENOUS | Status: DC | PRN
  Administered 2021-12-24: 50 mg via INTRAVENOUS

## 2021-12-24 MED ORDER — CHLORHEXIDINE GLUCONATE 0.12 % MT SOLN
15.0000 mL | Freq: Once | OROMUCOSAL | Status: AC
  Administered 2021-12-24: 15 mL via OROMUCOSAL

## 2021-12-24 MED ORDER — TECHNETIUM TC 99M TILMANOCEPT KIT
1.0000 | PACK | Freq: Once | INTRAVENOUS | Status: AC | PRN
  Administered 2021-12-24: 1 via INTRADERMAL

## 2021-12-24 MED ORDER — DEXAMETHASONE SODIUM PHOSPHATE 10 MG/ML IJ SOLN
INTRAMUSCULAR | Status: AC
  Filled 2021-12-24: qty 1

## 2021-12-24 MED ORDER — 0.9 % SODIUM CHLORIDE (POUR BTL) OPTIME
TOPICAL | Status: DC | PRN
  Administered 2021-12-24: 1000 mL

## 2021-12-24 MED ORDER — EPHEDRINE 5 MG/ML INJ
INTRAVENOUS | Status: AC
  Filled 2021-12-24: qty 5

## 2021-12-24 MED ORDER — ONDANSETRON HCL 4 MG/2ML IJ SOLN
INTRAMUSCULAR | Status: AC
  Filled 2021-12-24: qty 2

## 2021-12-24 MED ORDER — HYDROMORPHONE HCL 1 MG/ML IJ SOLN: 0.2500 mg | INTRAMUSCULAR | Status: DC | PRN

## 2021-12-24 MED ORDER — ONDANSETRON HCL 4 MG/2ML IJ SOLN
INTRAMUSCULAR | Status: DC | PRN
  Administered 2021-12-24: 4 mg via INTRAVENOUS

## 2021-12-24 MED ORDER — ORAL CARE MOUTH RINSE: 15.0000 mL | Freq: Once | OROMUCOSAL | Status: AC

## 2021-12-24 MED ORDER — KETOROLAC TROMETHAMINE 30 MG/ML IJ SOLN
30.0000 mg | Freq: Once | INTRAMUSCULAR | Status: AC
  Administered 2021-12-24: 30 mg via INTRAVENOUS
  Filled 2021-12-24: qty 1

## 2021-12-24 MED ORDER — BUPIVACAINE LIPOSOME 1.3 % IJ SUSP
INTRAMUSCULAR | Status: DC | PRN
  Administered 2021-12-24: 20 mL

## 2021-12-24 MED ORDER — EPHEDRINE SULFATE (PRESSORS) 50 MG/ML IJ SOLN
INTRAMUSCULAR | Status: DC | PRN
  Administered 2021-12-24: 5 mg via INTRAVENOUS

## 2021-12-24 MED ORDER — CHLORHEXIDINE GLUCONATE CLOTH 2 % EX PADS: 6.0000 | MEDICATED_PAD | Freq: Once | CUTANEOUS | Status: DC

## 2021-12-24 MED ORDER — DEXAMETHASONE SODIUM PHOSPHATE 10 MG/ML IJ SOLN
INTRAMUSCULAR | Status: DC | PRN
  Administered 2021-12-24: 10 mg via INTRAVENOUS

## 2021-12-24 MED ORDER — FENTANYL CITRATE (PF) 100 MCG/2ML IJ SOLN
INTRAMUSCULAR | Status: DC | PRN
Start: 2021-12-24 — End: 2021-12-24
  Administered 2021-12-24: 50 ug via INTRAVENOUS
  Administered 2021-12-24: 100 ug via INTRAVENOUS
  Administered 2021-12-24: 50 ug via INTRAVENOUS

## 2021-12-24 MED ORDER — FENTANYL CITRATE (PF) 250 MCG/5ML IJ SOLN
INTRAMUSCULAR | Status: AC
  Filled 2021-12-24: qty 5

## 2021-12-24 MED ORDER — PROPOFOL 10 MG/ML IV BOLUS
INTRAVENOUS | Status: DC | PRN
  Administered 2021-12-24: 160 mg via INTRAVENOUS

## 2021-12-24 MED ORDER — ROCURONIUM BROMIDE 10 MG/ML (PF) SYRINGE
PREFILLED_SYRINGE | INTRAVENOUS | Status: AC
  Filled 2021-12-24: qty 10

## 2021-12-24 MED ORDER — MEPERIDINE HCL 50 MG/ML IJ SOLN: 6.2500 mg | INTRAMUSCULAR | Status: DC | PRN

## 2021-12-24 MED ORDER — PENTAFLUOROPROP-TETRAFLUOROETH EX AERO
INHALATION_SPRAY | CUTANEOUS | Status: AC
  Filled 2021-12-24: qty 116

## 2021-12-24 MED ORDER — LIDOCAINE HCL (CARDIAC) PF 100 MG/5ML IV SOSY
PREFILLED_SYRINGE | INTRAVENOUS | Status: DC | PRN
  Administered 2021-12-24: 80 mg via INTRAVENOUS

## 2021-12-24 MED ORDER — LACTATED RINGERS IV SOLN: INTRAVENOUS | Status: DC

## 2021-12-24 MED ORDER — CEFAZOLIN SODIUM-DEXTROSE 2-4 GM/100ML-% IV SOLN
2.0000 g | INTRAVENOUS | Status: AC
  Administered 2021-12-24: 2 g via INTRAVENOUS
  Filled 2021-12-24: qty 100

## 2021-12-24 MED ORDER — LIDOCAINE HCL (PF) 2 % IJ SOLN
INTRAMUSCULAR | Status: AC
  Filled 2021-12-24: qty 5

## 2021-12-24 SURGICAL SUPPLY — 36 items
ADH SKN CLS APL DERMABOND .7 (GAUZE/BANDAGES/DRESSINGS) ×1
APL PRP STRL LF DISP 70% ISPRP (MISCELLANEOUS) ×1
APPLIER CLIP 9.375 SM OPEN (CLIP) ×2
APR CLP SM 9.3 20 MLT OPN (CLIP) ×1
BLADE SURG 15 STRL LF DISP TIS (BLADE) ×1 IMPLANT
BLADE SURG 15 STRL SS (BLADE) ×2
CHLORAPREP W/TINT 26 (MISCELLANEOUS) ×2 IMPLANT
CLIP APPLIE 9.375 SM OPEN (CLIP) ×1 IMPLANT
COVER PROBE W GEL 5X96 (DRAPES) ×2 IMPLANT
COVER SURGICAL LIGHT HANDLE (MISCELLANEOUS) ×4 IMPLANT
DECANTER SPIKE VIAL GLASS SM (MISCELLANEOUS) ×2 IMPLANT
DERMABOND ADVANCED (GAUZE/BANDAGES/DRESSINGS) ×1
DERMABOND ADVANCED .7 DNX12 (GAUZE/BANDAGES/DRESSINGS) ×1 IMPLANT
ELECT REM PT RETURN 9FT ADLT (ELECTROSURGICAL) ×2
ELECTRODE REM PT RTRN 9FT ADLT (ELECTROSURGICAL) ×1 IMPLANT
GLOVE BIOGEL PI IND STRL 7.0 (GLOVE) ×2 IMPLANT
GLOVE BIOGEL PI INDICATOR 7.0 (GLOVE) ×3
GLOVE SURG SS PI 7.5 STRL IVOR (GLOVE) ×3 IMPLANT
GOWN STRL REUS W/TWL LRG LVL3 (GOWN DISPOSABLE) ×4 IMPLANT
INST SET MINOR GENERAL (KITS) ×2 IMPLANT
KIT TURNOVER KIT A (KITS) ×2 IMPLANT
NDL HYPO 18GX1.5 BLUNT FILL (NEEDLE) ×1 IMPLANT
NEEDLE HYPO 18GX1.5 BLUNT FILL (NEEDLE) ×2 IMPLANT
NEEDLE HYPO 22GX1.5 SAFETY (NEEDLE) ×2 IMPLANT
NS IRRIG 1000ML POUR BTL (IV SOLUTION) ×2 IMPLANT
PACK MINOR (CUSTOM PROCEDURE TRAY) ×2 IMPLANT
PAD ARMBOARD 7.5X6 YLW CONV (MISCELLANEOUS) ×4 IMPLANT
SET BASIN LINEN APH (SET/KITS/TRAYS/PACK) ×2 IMPLANT
SUT ETHIBOND 0 MO6 C/R (SUTURE) ×2 IMPLANT
SUT MNCRL AB 4-0 PS2 18 (SUTURE) ×2 IMPLANT
SUT SILK 2 0 SH (SUTURE) ×2 IMPLANT
SUT VIC AB 3-0 SH 27 (SUTURE) ×2
SUT VIC AB 3-0 SH 27X BRD (SUTURE) ×1 IMPLANT
SYR 10ML LL (SYRINGE) ×2 IMPLANT
SYR 20ML LL LF (SYRINGE) ×2 IMPLANT
SYR CONTROL 10ML LL (SYRINGE) ×2 IMPLANT

## 2021-12-24 NOTE — Anesthesia Procedure Notes (Signed)
Procedure Name: Intubation Date/Time: 12/24/2021 10:40 AM  Performed by: Minerva Ends, CRNAPre-anesthesia Checklist: Patient identified, Emergency Drugs available, Suction available and Patient being monitored Patient Re-evaluated:Patient Re-evaluated prior to induction Oxygen Delivery Method: Circle system utilized Preoxygenation: Pre-oxygenation with 100% oxygen Induction Type: IV induction Ventilation: Mask ventilation without difficulty and Oral airway inserted - appropriate to patient size Laryngoscope Size: Mac and 3 Grade View: Grade I Tube type: Oral Tube size: 7.0 mm Number of attempts: 1 Airway Equipment and Method: Stylet and Oral airway Placement Confirmation: ETT inserted through vocal cords under direct vision, positive ETCO2 and breath sounds checked- equal and bilateral Secured at: 22 cm Tube secured with: Tape Dental Injury: Teeth and Oropharynx as per pre-operative assessment

## 2021-12-24 NOTE — Op Note (Signed)
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  At the request of Dr. Delton Coombes of oncology, a sentinel lymph node biopsy will be performed.  The risks and benefits of the procedures including bleeding, infection, and the possibility of recurrent surgery for unclear margins were fully explained to the patient, who gave informed consent.  Procedure note: The patient was placed in the supine position.  After induction of general endotracheal anesthesia, the right breast was injected with Magtrace in the subareolar region.  It was massaged into the right breast for 5 minutes.  The right breast and axilla were then prepped and draped using the usual sterile technique with ChloraPrep.  Surgical site confirmation was performed.  I first localized the radiofrequency tag using the Hologic probe.  An incision was made in the upper, outer quadrant of the right breast.  I was able to localize the tag without difficulty.  It was superficial and fell out of the specimen during dissection.  I circumferentially went around the area of concern.  Specimen radiography revealed the clips and microcalcifications to be within the specimen removed.  A short suture was placed superiorly and a long suture placed laterally for orientation purposes.  The specimen was then sent to pathology further examination.  I then turned my attention to the sentinel lymph node biopsy.  I used the Magtrace system to localize 3 sentinel lymph nodes.  All were removed using the hand-held harmonic scalpel.  All were brown in nature.  Care was taken to  avoid the thoracodorsal artery and vein and nerve complex.  Basin counts were less than 10% of the ex vivo counts.  Both wounds were irrigated with normal saline.  A superficial bleeding was controlled using Bovie electrocautery.  Exparel was instilled into the surrounding incisions.  Subcutaneous layers were reapproximated using 3-0 Vicryl interrupted suture.  The skin was closed using a 4-0 Monocryl subcuticular suture.  Dermabond was applied to both wounds.  All tape and needle counts were correct at the end of the procedure.  The patient was extubated in the operating room and transferred to PACU in stable condition.  They were sent to pathology for further examination and treatment.  Complications: None  EBL: 25 cc  Specimen: Right breast tissue, short suture superior, long suture lateral Right axillary sentinel lymph nodes

## 2021-12-24 NOTE — Progress Notes (Signed)
Dr Arnoldo Morale at bedside to check pt. Hematoma to right breast aspirated. 3 ml of blood obtained. Tolerated well. Small drsg applied. Mastectomy bra applied.

## 2021-12-24 NOTE — Transfer of Care (Signed)
Immediate Anesthesia Transfer of Care Note  Patient: Katrina Harvey  Procedure(s) Performed: PARTIAL MASTECTOMY WITH AXILLARY SENTINEL LYMPH NODE WITH RADIO FREQUENCY TAG PLACEMENT (Right)  Patient Location: PACU  Anesthesia Type:General  Level of Consciousness: alert , oriented and drowsy  Airway & Oxygen Therapy: Patient Spontanous Breathing and Patient connected to nasal cannula oxygen  Post-op Assessment: Report given to RN and Post -op Vital signs reviewed and stable  Post vital signs: Reviewed and stable  Last Vitals:  Vitals Value Taken Time  BP 144/65 12/24/21 1206  Temp    Pulse 88 12/24/21 1209  Resp 19 12/24/21 1209  SpO2 91 % 12/24/21 1209  Vitals shown include unvalidated device data.  Last Pain:  Vitals:   12/24/21 0752  TempSrc: Oral  PainSc: 0-No pain         Complications: No notable events documented.

## 2021-12-24 NOTE — Interval H&P Note (Signed)
History and Physical Interval Note:  12/24/2021 7:57 AM  Katrina Harvey  has presented today for surgery, with the diagnosis of DCIS RIGHT BREAST ER +.  The various methods of treatment have been discussed with the patient and family. After consideration of risks, benefits and other options for treatment, the patient has consented to  Procedure(s): PARTIAL MASTECTOMY WITH AXILLARY SENTINEL LYMPH NODE WITH RADIO FREQUENCY TAG PLACEMENT (Right) as a surgical intervention.  The patient's history has been reviewed, patient examined, no change in status, stable for surgery.  I have reviewed the patient's chart and labs.  Questions were answered to the patient's satisfaction.     Aviva Signs

## 2021-12-24 NOTE — Progress Notes (Signed)
Small amt of swelling with bruising to right breast. Dr Arnoldo Morale notified. Will be in to check pt.

## 2021-12-24 NOTE — Anesthesia Postprocedure Evaluation (Signed)
Anesthesia Post Note  Patient: Katrina Harvey  Procedure(s) Performed: PARTIAL MASTECTOMY WITH AXILLARY SENTINEL LYMPH NODE WITH RADIO FREQUENCY TAG PLACEMENT (Right)  Patient location during evaluation: Phase II Anesthesia Type: General Level of consciousness: awake and alert and oriented Pain management: pain level controlled Vital Signs Assessment: post-procedure vital signs reviewed and stable Respiratory status: spontaneous breathing, nonlabored ventilation and respiratory function stable Cardiovascular status: blood pressure returned to baseline and stable Postop Assessment: no apparent nausea or vomiting Anesthetic complications: no   No notable events documented.   Last Vitals:  Vitals:   12/24/21 1315 12/24/21 1330  BP: 115/61 120/61  Pulse: 66 66  Resp: 13 17  Temp:    SpO2: 91% 90%    Last Pain:  Vitals:   12/24/21 1330  TempSrc:   PainSc: 0-No pain                 Jeb Schloemer C Spurgeon Gancarz

## 2021-12-24 NOTE — Anesthesia Preprocedure Evaluation (Signed)
Anesthesia Evaluation  Patient identified by MRN, date of birth, ID band Patient awake    Reviewed: Allergy & Precautions, NPO status , Patient's Chart, lab work & pertinent test results  Airway Mallampati: III  TM Distance: >3 FB   Mouth opening: Limited Mouth Opening  Dental  (+) Dental Advisory Given, Teeth Intact   Pulmonary Current Smoker and Patient abstained from smoking.,    Pulmonary exam normal breath sounds clear to auscultation       Cardiovascular Exercise Tolerance: Good negative cardio ROS Normal cardiovascular exam Rhythm:Regular Rate:Normal     Neuro/Psych CVA (craniotomy) negative psych ROS   GI/Hepatic negative GI ROS, Neg liver ROS,   Endo/Other  negative endocrine ROS  Renal/GU negative Renal ROS  negative genitourinary   Musculoskeletal negative musculoskeletal ROS (+)   Abdominal   Peds negative pediatric ROS (+)  Hematology negative hematology ROS (+)   Anesthesia Other Findings   Reproductive/Obstetrics negative OB ROS                            Anesthesia Physical Anesthesia Plan  ASA: 2  Anesthesia Plan: General   Post-op Pain Management: Dilaudid IV   Induction:   PONV Risk Score and Plan: 3 and Ondansetron and Dexamethasone  Airway Management Planned: LMA  Additional Equipment:   Intra-op Plan:   Post-operative Plan: Extubation in OR  Informed Consent: I have reviewed the patients History and Physical, chart, labs and discussed the procedure including the risks, benefits and alternatives for the proposed anesthesia with the patient or authorized representative who has indicated his/her understanding and acceptance.     Dental advisory given  Plan Discussed with: CRNA and Surgeon  Anesthesia Plan Comments:         Anesthesia Quick Evaluation

## 2021-12-27 ENCOUNTER — Encounter (HOSPITAL_COMMUNITY): Payer: Self-pay | Admitting: General Surgery

## 2021-12-29 LAB — SURGICAL PATHOLOGY

## 2022-01-04 ENCOUNTER — Ambulatory Visit (INDEPENDENT_AMBULATORY_CARE_PROVIDER_SITE_OTHER): Payer: Medicare HMO | Admitting: General Surgery

## 2022-01-04 ENCOUNTER — Encounter: Payer: Self-pay | Admitting: General Surgery

## 2022-01-04 VITALS — BP 97/65 | HR 70 | Temp 98.5°F | Resp 14 | Ht 63.0 in | Wt 201.0 lb

## 2022-01-04 DIAGNOSIS — Z09 Encounter for follow-up examination after completed treatment for conditions other than malignant neoplasm: Secondary | ICD-10-CM

## 2022-01-04 DIAGNOSIS — C779 Secondary and unspecified malignant neoplasm of lymph node, unspecified: Secondary | ICD-10-CM

## 2022-01-04 NOTE — H&P (Signed)
Katrina Harvey; [id number]; [birth year]   HPI Patient is a 51 year old white female who was referred to my care by Dr. 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  This was found on routine screening mammography.  This is biopsy-proven to be DCIS with ER/PR positivity.  Patient did not feel a lump.  She has had no bloody nipple discharge.  There is no family history of breast cancer.  Patient underwent a right partial mastectomy with sentinel lymph node biopsy on 11/24/2021.  Final pathology revealed DCIS with focal invasive components.  All the lymph nodes were negative.  She did have a margin from the DCIS which was less than 1 mm. History reviewed. No pertinent past medical history.  History reviewed. No pertinent surgical history.  Family History  Problem Relation Age of Onset   Breast cancer Neg Hx     Current Outpatient Medications on File Prior to Visit  Medication Sig Dispense Refill   Cholecalciferol (VITAMIN D) 50 MCG (2000 UT) tablet Take 2,000 Units by mouth daily.     escitalopram (LEXAPRO) 10 MG tablet Take 10 mg by mouth daily.     No current facility-administered medications on file prior to visit.    No Known Allergies  Social History   Substance and Sexual Activity  Alcohol Use Not Currently    Social History   Tobacco Use  Smoking Status Every Day   Types: Cigarettes  Smokeless Tobacco Never    Review of Systems  HENT: Negative.    Eyes:  Positive for blurred vision.  Respiratory: Negative.    Cardiovascular: Negative.   Gastrointestinal: Negative.   Genitourinary: Negative.   Musculoskeletal:  Positive for back pain and joint pain.  Skin:  Positive for rash.  Neurological: Negative.   Endo/Heme/Allergies: Negative.   Psychiatric/Behavioral: Negative.      Objective   Vitals:   11/25/21 0905  BP: 116/75  Pulse: 63  Resp: 12  Temp: 98.1 F (36.7 C)  SpO2: 96%    Physical Exam Vitals reviewed. Exam  conducted with a chaperone present.  Constitutional:      Appearance: Normal appearance. She is not ill-appearing.  HENT:     Head: Normocephalic and atraumatic.  Cardiovascular:     Rate and Rhythm: Normal rate and regular rhythm.     Heart sounds: Normal heart sounds. No murmur heard.    No friction rub. No gallop.  Pulmonary:     Effort: Pulmonary effort is normal. No respiratory distress.     Breath sounds: Normal breath sounds. No stridor. No wheezing, rhonchi or rales.  Musculoskeletal:     Cervical back: Normal range of motion and neck supple.  Lymphadenopathy:     Cervical: No cervical adenopathy.  Skin:    General: Skin is warm and dry.  Neurological:     Mental Status: She is alert and oriented to person, place, and time.   Breast: An indistinct area of increased density is noted in the upper, outer quadrant of the right breast.  No ecchymosis present.  No nipple discharge or dimpling is noted.  The axilla is negative for palpable nodes.  Left breast examination reveals no dominant mass, nipple discharge, or dimpling.  The axilla is negative for palpable nodes.  Dr. Tomie China notes reviewed.  Mammogram report reviewed.  Impression: Status post right partial mastectomy for DCIS with focal invasive component.  One of the margins was less than 1 mm.  This by protocol requires reexcision.  Plan: Patient will undergo reexcision of the right partial mastectomy on 01/17/2022.  The risks and benefits of the procedure including bleeding, infection, and unclear margins were fully explained to the patient, who gave informed consent.

## 2022-01-04 NOTE — Progress Notes (Signed)
Subjective:     Katrina Harvey  Patient presents for postoperative wound check.  She is doing well.  She has minimal incisional pain. Objective:    BP 97/65   Pulse 70   Temp 98.5 F (36.9 C) (Oral)   Resp 14   Ht '5\' 3"'$  (1.6 m)   Wt 201 lb (91.2 kg)   SpO2 94%   BMI 35.61 kg/m   General:  alert, cooperative, and no distress  Right breast and axillary incisions all healing well.  No significant hematoma present in the right breast. Final pathology reveals right breast DCIS with focal invasive components.  3 sentinel lymph nodes were negative for metastatic disease.  The posterior medial margin was noted to be less than 1 mm from DCIS.     Assessment:    Doing well postoperatively. Less than 1 mm margin from DCIS at posterior medial aspect    Plan:   I explained to the patient that given the close margin in the face of DCIS, I need to go back in and excise more breast tissue from the mastectomy cavity.  She understands and agrees to the procedure.  The risks and benefits of the procedure including bleeding, infection, and additional unclear margins were fully explained to the patient, who gave informed consent.  Surgery is scheduled for 01/17/2022.

## 2022-01-05 ENCOUNTER — Other Ambulatory Visit (INDEPENDENT_AMBULATORY_CARE_PROVIDER_SITE_OTHER): Payer: Medicare HMO | Admitting: General Surgery

## 2022-01-05 ENCOUNTER — Ambulatory Visit (HOSPITAL_COMMUNITY): Payer: Medicare HMO | Admitting: Hematology

## 2022-01-05 DIAGNOSIS — Z09 Encounter for follow-up examination after completed treatment for conditions other than malignant neoplasm: Secondary | ICD-10-CM

## 2022-01-05 MED ORDER — HYDROCODONE-ACETAMINOPHEN 5-325 MG PO TABS: 1.0000 | ORAL_TABLET | ORAL | 0 refills | Status: DC | PRN

## 2022-01-05 NOTE — Progress Notes (Signed)
Reordered hydrocodone for pain.

## 2022-01-11 NOTE — Patient Instructions (Signed)
Katrina Harvey  01/11/2022     '@PREFPERIOPPHARMACY'$ @   Your procedure is scheduled on  01/17/2022.   Report to [facility] at  0700  A.M.   Call this number if you have problems the morning of surgery:  [phone]   Remember:  Do not eat or drink after midnight.      Take these medicines the morning of surgery with A SIP OF WATER                                           lexapro, norco (if needed).     Do not wear jewelry, make-up or nail polish.  Do not wear lotions, powders, or perfumes, or deodorant.  Do not shave 48 hours prior to surgery.  Men may shave face and neck.  Do not bring valuables to the hospital.  [facility] is not responsible for any belongings or valuables.  Contacts, dentures or bridgework may not be worn into surgery.  Leave your suitcase in the car.  After surgery it may be brought to your room.  For patients admitted to the hospital, discharge time will be determined by your treatment team.  Patients discharged the day of surgery will not be allowed to drive home and must have someone with them for 24 hours.    Special instructions:   DO NOT smoke tobacco or vape for 24 hours before your procedure.  Please read over the following fact sheets that you were given. Coughing and Deep Breathing, Surgical Site Infection Prevention, Anesthesia Post-op Instructions, and Care and Recovery After Surgery        Breast Biopsy, Care After The following information offers guidance on how to care for yourself after your breast biopsy. Your doctor may also give you more specific instructions. If you have problems or questions, contact your doctor. What can I expect after the procedure? After a breast biopsy, it is common to have: Bruising on your breast. Breast swelling. Numbness, tingling, or pain near your biopsy site. This site is where tissue was taken out for study. Follow these instructions at home: Medicines Take over-the-counter and  prescription medicines only as told by your doctor. If you were given a sedative during your procedure, do not drive or use machines until your doctor says that it is safe. A sedative is a medicine that helps you relax. Do not drink alcohol while taking pain medicine. Ask your doctor if you should avoid driving or using machines while you are taking your medicine. Biopsy site care     Follow instructions from your doctor about how to take care of your cut from surgery (incision) or your puncture site. Make sure you: Wash your hands with soap and water for at least 20 seconds before and after you change your bandage. If you cannot use soap and water, use hand sanitizer. Change your bandage. Leave stitches or skin glue in place for at least 2 weeks. Leave tape strips alone unless you are told to take them off. You may trim the edges of the tape strips if they curl up. If you have stitches, keep them dry when you take a bath or a shower. Check your cut or puncture site every day for signs of infection. Look for: More redness, swelling, or pain. More fluid or blood. Warmth. Pus or a bad smell. Protect the  biopsy site. Do not let the site get bumped. Managing pain If told, put ice on the biopsy site. To do this: Put ice in a plastic bag. Place a towel between your skin and the bag. Leave the ice on for 20 minutes, 2-3 times a day. Take off the ice if your skin turns bright red. This is very important. If you cannot feel pain, heat, or cold, you have a greater risk of damage to the area. Activity If a cut was made in your skin to do the biopsy, avoid activities that could pull your cut open. These include: Stretching. Reaching over your head. Exercise. Sports. Lifting anything that weighs more than 3 lb (1.4 kg). Return to your normal activities when your doctor says that it is safe. General instructions Follow your normal diet. Wear a good support bra for as long as told by your  doctor. Get checked for extra fluid around your lymph nodes (lymphedema) as often as told. Do not smoke or use any products that contain nicotine or tobacco. If you need help quitting, ask your doctor. Keep all follow-up visits. Contact a doctor if: You notice any of these at or near the biopsy site: More redness, swelling, or pain. More fluid or blood. Warmth. Pus or a bad smell. The site breaking open after the stitches or skin tape strips have been removed. You have a rash or a fever. Get help right away if: You have trouble breathing. You have red streaks around the biopsy site. Summary After a breast biopsy, it is common to have bruising, numbness, tingling, or pain near your biopsy site. Ask your doctor if you should avoid driving or using machines while you are taking your medicine. If you had a cut made in your skin to do the biopsy, avoid activities that may pull the cut open. Return to your normal activities when your doctor says that it is safe. Wear a good support bra for as long as told by your doctor. This information is not intended to replace advice given to you by your health care provider. Make sure you discuss any questions you have with your health care provider. Document Revised: 03/17/2021 Document Reviewed: 03/17/2021 Elsevier Patient Education  [place] Anesthesia, Adult, Care After The following information offers guidance on how to care for yourself after your procedure. Your health care provider may also give you more specific instructions. If you have problems or questions, contact your health care provider. What can I expect after the procedure? After the procedure, it is common for people to: Have pain or discomfort at the IV site. Have nausea or vomiting. Have a sore throat or hoarseness. Have trouble concentrating. Feel cold or chills. Feel weak, sleepy, or tired (fatigue). Have soreness and body aches. These can affect parts of the  body that were not involved in surgery. Follow these instructions at home: For the time period you were told by your health care provider:  Rest. Do not participate in activities where you could fall or become injured. Do not drive or use machinery. Do not drink alcohol. Do not take sleeping pills or medicines that cause drowsiness. Do not make important decisions or sign legal documents. Do not take care of children on your own. General instructions Drink enough fluid to keep your urine pale yellow. If you have sleep apnea, surgery and certain medicines can increase your risk for breathing problems. Follow instructions from your health care provider about wearing your sleep device: Anytime  you are sleeping, including during daytime naps. While taking prescription pain medicines, sleeping medicines, or medicines that make you drowsy. Return to your normal activities as told by your health care provider. Ask your health care provider what activities are safe for you. Take over-the-counter and prescription medicines only as told by your health care provider. Do not use any products that contain nicotine or tobacco. These products include cigarettes, chewing tobacco, and vaping devices, such as e-cigarettes. These can delay incision healing after surgery. If you need help quitting, ask your health care provider. Contact a health care provider if: You have nausea or vomiting that does not get better with medicine. You vomit every time you eat or drink. You have pain that does not get better with medicine. You cannot urinate or have bloody urine. You develop a skin rash. You have a fever. Get help right away if: You have trouble breathing. You have chest pain. You vomit blood. These symptoms may be an emergency. Get help right away. Call 911. Do not wait to see if the symptoms will go away. Do not drive yourself to the hospital. Summary After the procedure, it is common to have a sore  throat, hoarseness, nausea, vomiting, or to feel weak, sleepy, or fatigue. For the time period you were told by your health care provider, do not drive or use machinery. Get help right away if you have difficulty breathing, have chest pain, or vomit blood. These symptoms may be an emergency. This information is not intended to replace advice given to you by your health care provider. Make sure you discuss any questions you have with your health care provider. Document Revised: 08/20/2021 Document Reviewed: 08/20/2021 Elsevier Patient Education  [place]. How to Use Chlorhexidine Before Surgery Chlorhexidine gluconate (CHG) is a germ-killing (antiseptic) solution that is used to clean the skin. It can get rid of the bacteria that normally live on the skin and can keep them away for about 24 hours. To clean your skin with CHG, you may be given: A CHG solution to use in the shower or as part of a sponge bath. A prepackaged cloth that contains CHG. Cleaning your skin with CHG may help lower the risk for infection: While you are staying in the intensive care unit of the hospital. If you have a vascular access, such as a central line, to provide short-term or long-term access to your veins. If you have a catheter to drain urine from your bladder. If you are on a ventilator. A ventilator is a machine that helps you breathe by moving air in and out of your lungs. After surgery. What are the risks? Risks of using CHG include: A skin reaction. Hearing loss, if CHG gets in your ears and you have a perforated eardrum. Eye injury, if CHG gets in your eyes and is not rinsed out. The CHG product catching fire. Make sure that you avoid smoking and flames after applying CHG to your skin. Do not use CHG: If you have a chlorhexidine allergy or have previously reacted to chlorhexidine. On babies younger than 59 months of age. How to use CHG solution Use CHG only as told by your health care provider,  and follow the instructions on the label. Use the full amount of CHG as directed. Usually, this is one bottle. During a shower Follow these steps when using CHG solution during a shower (unless your health care provider gives you different instructions): Start the shower. Use your normal soap and  shampoo to wash your face and hair. Turn off the shower or move out of the shower stream. Pour the CHG onto a clean washcloth. Do not use any type of brush or rough-edged sponge. Starting at your neck, lather your body down to your toes. Make sure you follow these instructions: If you will be having surgery, pay special attention to the part of your body where you will be having surgery. Scrub this area for at least 1 minute. Do not use CHG on your head or face. If the solution gets into your ears or eyes, rinse them well with water. Avoid your genital area. Avoid any areas of skin that have broken skin, cuts, or scrapes. Scrub your back and under your arms. Make sure to wash skin folds. Let the lather sit on your skin for 1-2 minutes or as long as told by your health care provider. Thoroughly rinse your entire body in the shower. Make sure that all body creases and crevices are rinsed well. Dry off with a clean towel. Do not put any substances on your body afterward--such as powder, lotion, or perfume--unless you are told to do so by your health care provider. Only use lotions that are recommended by the manufacturer. Put on clean clothes or pajamas. If it is the night before your surgery, sleep in clean sheets.  During a sponge bath Follow these steps when using CHG solution during a sponge bath (unless your health care provider gives you different instructions): Use your normal soap and shampoo to wash your face and hair. Pour the CHG onto a clean washcloth. Starting at your neck, lather your body down to your toes. Make sure you follow these instructions: If you will be having surgery, pay  special attention to the part of your body where you will be having surgery. Scrub this area for at least 1 minute. Do not use CHG on your head or face. If the solution gets into your ears or eyes, rinse them well with water. Avoid your genital area. Avoid any areas of skin that have broken skin, cuts, or scrapes. Scrub your back and under your arms. Make sure to wash skin folds. Let the lather sit on your skin for 1-2 minutes or as long as told by your health care provider. Using a different clean, wet washcloth, thoroughly rinse your entire body. Make sure that all body creases and crevices are rinsed well. Dry off with a clean towel. Do not put any substances on your body afterward--such as powder, lotion, or perfume--unless you are told to do so by your health care provider. Only use lotions that are recommended by the manufacturer. Put on clean clothes or pajamas. If it is the night before your surgery, sleep in clean sheets. How to use CHG prepackaged cloths Only use CHG cloths as told by your health care provider, and follow the instructions on the label. Use the CHG cloth on clean, dry skin. Do not use the CHG cloth on your head or face unless your health care provider tells you to. When washing with the CHG cloth: Avoid your genital area. Avoid any areas of skin that have broken skin, cuts, or scrapes. Before surgery Follow these steps when using a CHG cloth to clean before surgery (unless your health care provider gives you different instructions): Using the CHG cloth, vigorously scrub the part of your body where you will be having surgery. Scrub using a back-and-forth motion for 3 minutes. The area on your body should  be completely wet with CHG when you are done scrubbing. Do not rinse. Discard the cloth and let the area air-dry. Do not put any substances on the area afterward, such as powder, lotion, or perfume. Put on clean clothes or pajamas. If it is the night before your  surgery, sleep in clean sheets.  For general bathing Follow these steps when using CHG cloths for general bathing (unless your health care provider gives you different instructions). Use a separate CHG cloth for each area of your body. Make sure you wash between any folds of skin and between your fingers and toes. Wash your body in the following order, switching to a new cloth after each step: The front of your neck, shoulders, and chest. Both of your arms, under your arms, and your hands. Your stomach and groin area, avoiding the genitals. Your right leg and foot. Your left leg and foot. The back of your neck, your back, and your buttocks. Do not rinse. Discard the cloth and let the area air-dry. Do not put any substances on your body afterward--such as powder, lotion, or perfume--unless you are told to do so by your health care provider. Only use lotions that are recommended by the manufacturer. Put on clean clothes or pajamas. Contact a health care provider if: Your skin gets irritated after scrubbing. You have questions about using your solution or cloth. You swallow any chlorhexidine. Call your local poison control center (1-(343)511-9435 in the [place] Get help right away if: Your eyes itch badly, or they become very red or swollen. Your skin itches badly and is red or swollen. Your hearing changes. You have trouble seeing. You have swelling or tingling in your mouth or throat. You have trouble breathing. These symptoms may represent a serious problem that is an emergency. Do not wait to see if the symptoms will go away. Get medical help right away. Call your local emergency services (911 in the [place] Do not drive yourself to the hospital. Summary Chlorhexidine gluconate (CHG) is a germ-killing (antiseptic) solution that is used to clean the skin. Cleaning your skin with CHG may help to lower your risk for infection. You may be given CHG to use for bathing. It may be in a bottle or in  a prepackaged cloth to use on your skin. Carefully follow your health care provider's instructions and the instructions on the product label. Do not use CHG if you have a chlorhexidine allergy. Contact your health care provider if your skin gets irritated after scrubbing. This information is not intended to replace advice given to you by your health care provider. Make sure you discuss any questions you have with your health care provider. Document Revised: 09/20/2021 Document Reviewed: 08/03/2020 Elsevier Patient Education  [place].

## 2022-01-13 ENCOUNTER — Encounter (HOSPITAL_COMMUNITY)
Admission: RE | Admit: 2022-01-13 | Discharge: 2022-01-13 | Disposition: A | Discharge status: Home / Self Care | Payer: Medicare HMO | Source: Ambulatory Visit | Attending: General Surgery | Admitting: General Surgery

## 2022-01-13 ENCOUNTER — Encounter (HOSPITAL_COMMUNITY): Payer: Self-pay

## 2022-01-13 DIAGNOSIS — Z01812 Encounter for preprocedural laboratory examination: Secondary | ICD-10-CM | POA: Insufficient documentation

## 2022-01-13 DIAGNOSIS — Z01818 Encounter for other preprocedural examination: Secondary | ICD-10-CM

## 2022-01-13 HISTORY — DX: Malignant (primary) neoplasm, unspecified: C80.1

## 2022-01-13 HISTORY — DX: Microscopic colitis, unspecified: K52.839

## 2022-01-13 HISTORY — DX: Arteriovenous malformation, site unspecified: Q27.30

## 2022-01-13 LAB — POCT PREGNANCY, URINE: Preg Test, Ur: NEGATIVE

## 2022-01-17 ENCOUNTER — Ambulatory Visit (HOSPITAL_COMMUNITY)
Admission: RE | Admit: 2022-01-17 | Discharge: 2022-01-17 | Disposition: A | Discharge status: Home / Self Care | Payer: Medicare HMO | Attending: General Surgery | Admitting: General Surgery

## 2022-01-17 ENCOUNTER — Encounter (HOSPITAL_COMMUNITY)
Admission: RE | Disposition: A | Discharge status: Home / Self Care | Payer: Self-pay | Source: Home / Self Care | Attending: General Surgery

## 2022-01-17 ENCOUNTER — Other Ambulatory Visit: Payer: Self-pay

## 2022-01-17 ENCOUNTER — Ambulatory Visit (HOSPITAL_BASED_OUTPATIENT_CLINIC_OR_DEPARTMENT_OTHER): Payer: Medicare HMO | Admitting: Anesthesiology

## 2022-01-17 ENCOUNTER — Encounter (HOSPITAL_COMMUNITY): Payer: Self-pay | Admitting: General Surgery

## 2022-01-17 ENCOUNTER — Ambulatory Visit (HOSPITAL_COMMUNITY): Payer: Medicare HMO | Admitting: Anesthesiology

## 2022-01-17 DIAGNOSIS — R920 Mammographic microcalcification found on diagnostic imaging of breast: Secondary | ICD-10-CM | POA: Diagnosis not present

## 2022-01-17 DIAGNOSIS — Z8673 Personal history of transient ischemic attack (TIA), and cerebral infarction without residual deficits: Secondary | ICD-10-CM | POA: Diagnosis not present

## 2022-01-17 DIAGNOSIS — N6041 Mammary duct ectasia of right breast: Secondary | ICD-10-CM | POA: Insufficient documentation

## 2022-01-17 DIAGNOSIS — D0511 Intraductal carcinoma in situ of right breast: Secondary | ICD-10-CM | POA: Diagnosis not present

## 2022-01-17 DIAGNOSIS — N6021 Fibroadenosis of right breast: Secondary | ICD-10-CM | POA: Diagnosis not present

## 2022-01-17 DIAGNOSIS — N6011 Diffuse cystic mastopathy of right breast: Secondary | ICD-10-CM | POA: Insufficient documentation

## 2022-01-17 DIAGNOSIS — C50911 Malignant neoplasm of unspecified site of right female breast: Secondary | ICD-10-CM | POA: Diagnosis not present

## 2022-01-17 DIAGNOSIS — F17210 Nicotine dependence, cigarettes, uncomplicated: Secondary | ICD-10-CM | POA: Insufficient documentation

## 2022-01-17 DIAGNOSIS — N6081 Other benign mammary dysplasias of right breast: Secondary | ICD-10-CM | POA: Insufficient documentation

## 2022-01-17 DIAGNOSIS — N62 Hypertrophy of breast: Secondary | ICD-10-CM | POA: Diagnosis not present

## 2022-01-17 HISTORY — PX: MASTECTOMY, PARTIAL: SHX709

## 2022-01-17 HISTORY — PX: BREAST LUMPECTOMY: SHX2

## 2022-01-17 SURGERY — MASTECTOMY PARTIAL
Anesthesia: General | Site: Breast | Laterality: Right

## 2022-01-17 MED ORDER — ONDANSETRON HCL 4 MG/2ML IJ SOLN
INTRAMUSCULAR | Status: AC
  Filled 2022-01-17: qty 2

## 2022-01-17 MED ORDER — 0.9 % SODIUM CHLORIDE (POUR BTL) OPTIME
TOPICAL | Status: DC | PRN
  Administered 2022-01-17: 1000 mL

## 2022-01-17 MED ORDER — LIDOCAINE HCL (PF) 2 % IJ SOLN
INTRAMUSCULAR | Status: AC
  Filled 2022-01-17: qty 5

## 2022-01-17 MED ORDER — CEFAZOLIN SODIUM-DEXTROSE 2-4 GM/100ML-% IV SOLN
2.0000 g | INTRAVENOUS | Status: AC
  Administered 2022-01-17: 2 g via INTRAVENOUS
  Filled 2022-01-17: qty 100

## 2022-01-17 MED ORDER — ORAL CARE MOUTH RINSE
15.0000 mL | Freq: Once | OROMUCOSAL | Status: AC
Start: 2022-01-17 — End: 2022-01-17

## 2022-01-17 MED ORDER — MIDAZOLAM HCL 2 MG/2ML IJ SOLN
INTRAMUSCULAR | Status: AC
  Filled 2022-01-17: qty 2

## 2022-01-17 MED ORDER — DEXAMETHASONE SODIUM PHOSPHATE 10 MG/ML IJ SOLN
INTRAMUSCULAR | Status: DC | PRN
  Administered 2022-01-17: 10 mg via INTRAVENOUS

## 2022-01-17 MED ORDER — BUPIVACAINE LIPOSOME 1.3 % IJ SUSP
INTRAMUSCULAR | Status: DC | PRN
  Administered 2022-01-17: 20 mL

## 2022-01-17 MED ORDER — ONDANSETRON HCL 4 MG/2ML IJ SOLN
4.0000 mg | Freq: Once | INTRAMUSCULAR | Status: AC | PRN
Start: 2022-01-17 — End: 2022-01-17
  Administered 2022-01-17: 4 mg via INTRAVENOUS

## 2022-01-17 MED ORDER — MEPERIDINE HCL 50 MG/ML IJ SOLN: 6.2500 mg | INTRAMUSCULAR | Status: DC | PRN

## 2022-01-17 MED ORDER — MIDAZOLAM HCL 5 MG/5ML IJ SOLN
INTRAMUSCULAR | Status: DC | PRN
  Administered 2022-01-17: 2 mg via INTRAVENOUS

## 2022-01-17 MED ORDER — FENTANYL CITRATE (PF) 100 MCG/2ML IJ SOLN
INTRAMUSCULAR | Status: AC
  Filled 2022-01-17: qty 2

## 2022-01-17 MED ORDER — PROPOFOL 10 MG/ML IV BOLUS
INTRAVENOUS | Status: AC
  Filled 2022-01-17: qty 20

## 2022-01-17 MED ORDER — CHLORHEXIDINE GLUCONATE 0.12 % MT SOLN
15.0000 mL | Freq: Once | OROMUCOSAL | Status: AC
  Administered 2022-01-17: 15 mL via OROMUCOSAL

## 2022-01-17 MED ORDER — EPHEDRINE 5 MG/ML INJ
INTRAVENOUS | Status: AC
  Filled 2022-01-17: qty 5

## 2022-01-17 MED ORDER — SUGAMMADEX SODIUM 200 MG/2ML IV SOLN
INTRAVENOUS | Status: DC | PRN
  Administered 2022-01-17: 200 mg via INTRAVENOUS

## 2022-01-17 MED ORDER — PHENYLEPHRINE 80 MCG/ML (10ML) SYRINGE FOR IV PUSH (FOR BLOOD PRESSURE SUPPORT)
PREFILLED_SYRINGE | INTRAVENOUS | Status: AC
  Filled 2022-01-17: qty 10

## 2022-01-17 MED ORDER — ROCURONIUM BROMIDE 10 MG/ML (PF) SYRINGE
PREFILLED_SYRINGE | INTRAVENOUS | Status: DC | PRN
  Administered 2022-01-17: 50 mg via INTRAVENOUS

## 2022-01-17 MED ORDER — BUPIVACAINE LIPOSOME 1.3 % IJ SUSP
INTRAMUSCULAR | Status: AC
  Filled 2022-01-17: qty 20

## 2022-01-17 MED ORDER — PROPOFOL 10 MG/ML IV BOLUS
INTRAVENOUS | Status: DC | PRN
  Administered 2022-01-17: 50 mg via INTRAVENOUS
  Administered 2022-01-17: 100 mg via INTRAVENOUS
  Administered 2022-01-17: 200 mg via INTRAVENOUS

## 2022-01-17 MED ORDER — HYDROCODONE-ACETAMINOPHEN 5-325 MG PO TABS: 1.0000 | ORAL_TABLET | ORAL | 0 refills | Status: DC | PRN

## 2022-01-17 MED ORDER — CHLORHEXIDINE GLUCONATE CLOTH 2 % EX PADS: 6.0000 | MEDICATED_PAD | Freq: Once | CUTANEOUS | Status: DC

## 2022-01-17 MED ORDER — FENTANYL CITRATE (PF) 100 MCG/2ML IJ SOLN
INTRAMUSCULAR | Status: DC | PRN
  Administered 2022-01-17 (×2): 50 ug via INTRAVENOUS

## 2022-01-17 MED ORDER — KETOROLAC TROMETHAMINE 30 MG/ML IJ SOLN
30.0000 mg | Freq: Once | INTRAMUSCULAR | Status: AC
  Administered 2022-01-17: 30 mg via INTRAVENOUS
  Filled 2022-01-17: qty 1

## 2022-01-17 MED ORDER — HYDROMORPHONE HCL 1 MG/ML IJ SOLN: 0.2500 mg | INTRAMUSCULAR | Status: DC | PRN

## 2022-01-17 MED ORDER — LIDOCAINE 2% (20 MG/ML) 5 ML SYRINGE
INTRAMUSCULAR | Status: DC | PRN
  Administered 2022-01-17: 100 mg via INTRAVENOUS

## 2022-01-17 MED ORDER — PHENYLEPHRINE 80 MCG/ML (10ML) SYRINGE FOR IV PUSH (FOR BLOOD PRESSURE SUPPORT)
PREFILLED_SYRINGE | INTRAVENOUS | Status: DC | PRN
  Administered 2022-01-17: 160 ug via INTRAVENOUS

## 2022-01-17 MED ORDER — LACTATED RINGERS IV SOLN
INTRAVENOUS | Status: DC
Start: 2022-01-17 — End: 2022-01-17

## 2022-01-17 SURGICAL SUPPLY — 29 items
ADH SKN CLS APL DERMABOND .7 (GAUZE/BANDAGES/DRESSINGS) ×1
APL PRP STRL LF DISP 70% ISPRP (MISCELLANEOUS) ×1
CHLORAPREP W/TINT 26 (MISCELLANEOUS) ×2 IMPLANT
CLOTH BEACON ORANGE TIMEOUT ST (SAFETY) ×2 IMPLANT
COVER LIGHT HANDLE STERIS (MISCELLANEOUS) ×4 IMPLANT
DERMABOND ADVANCED (GAUZE/BANDAGES/DRESSINGS) ×1
DERMABOND ADVANCED .7 DNX12 (GAUZE/BANDAGES/DRESSINGS) ×1 IMPLANT
ELECT REM PT RETURN 9FT ADLT (ELECTROSURGICAL) ×2
ELECTRODE REM PT RTRN 9FT ADLT (ELECTROSURGICAL) ×1 IMPLANT
GLOVE BIOGEL PI IND STRL 7.0 (GLOVE) ×2 IMPLANT
GLOVE BIOGEL PI INDICATOR 7.0 (GLOVE) ×2
GLOVE SURG SS PI 7.5 STRL IVOR (GLOVE) ×2 IMPLANT
GOWN STRL REUS W/TWL LRG LVL3 (GOWN DISPOSABLE) ×4 IMPLANT
KIT TURNOVER KIT A (KITS) ×2 IMPLANT
MANIFOLD NEPTUNE II (INSTRUMENTS) ×2 IMPLANT
NDL HYPO 25X1 1.5 SAFETY (NEEDLE) ×1 IMPLANT
NEEDLE HYPO 25X1 1.5 SAFETY (NEEDLE) ×2 IMPLANT
NS IRRIG 1000ML POUR BTL (IV SOLUTION) ×2 IMPLANT
PACK MINOR (CUSTOM PROCEDURE TRAY) ×2 IMPLANT
PAD ARMBOARD 7.5X6 YLW CONV (MISCELLANEOUS) ×2 IMPLANT
PENCIL SMOKE EVACUATOR (MISCELLANEOUS) ×2 IMPLANT
SET BASIN LINEN APH (SET/KITS/TRAYS/PACK) ×2 IMPLANT
SPONGE T-LAP 18X18 ~~LOC~~+RFID (SPONGE) ×2 IMPLANT
SUT MNCRL AB 4-0 PS2 18 (SUTURE) ×2 IMPLANT
SUT SILK 2 0 SH (SUTURE) IMPLANT
SUT VIC AB 3-0 SH 27 (SUTURE) ×2
SUT VIC AB 3-0 SH 27X BRD (SUTURE) ×1 IMPLANT
SYR 20ML LL LF (SYRINGE) ×1 IMPLANT
SYR BULB IRRIG 60ML STRL (SYRINGE) ×1 IMPLANT

## 2022-01-17 NOTE — Op Note (Signed)
Patient:  Katrina Harvey  DOB:  1971/05/13  MRN:  [id number]   Preop Diagnosis: Ductal carcinoma in situ/invasive ductal carcinoma, right breast  Postop Diagnosis: Same  Procedure: Right partial mastectomy  Surgeon: Aviva Signs, MD  Anes: General endotracheal  Indications: Patient is a 51 year old white female status post right partial mastectomy and sentinel lymph node biopsy for right breast cancer who was found to have a less than 1 mm margin in the posterior region close to DCIS.  The patient now comes back for reexcision for clear margins.  The risks and benefits of the procedure including bleeding, infection, and unclear margins were fully explained to the patient, who gave informed consent.  Procedure note: The patient was placed in the supine position.  After induction of general tracheal anesthesia, the right breast was prepped and draped using usual sterile technique with ChloraPrep.  Surgical site confirmation was performed.  An incision was made through the previous surgical incision site.  80 moderate amount of clotted blood was noted within the wound.  This was removed without difficulty.  The previous pockets from the partial mastectomy was excised to grossly normal margins.  A short suture was placed superiorly and a long suture placed laterally for orientation purposes.  The specimen was removed and sent to pathology for further examination.  A bleeding was controlled using Bovie electrocautery.  The wound was irrigated normal saline.  The subcutaneous layer was reapproximated using a 3-0 Vicryl interrupted suture.  Exparel was instilled into the surrounding wound.  The skin was closed using a 4-0 Monocryl subcuticular suture.  Dermabond was applied.  All tape and needle counts were correct at the end of the procedure.  The patient was extubated in the operating room and transferred to PACU in stable condition.  Complications: None  EBL: Minimal  Specimen: Right breast  tissue

## 2022-01-17 NOTE — Anesthesia Preprocedure Evaluation (Signed)
Anesthesia Evaluation  Patient identified by MRN, date of birth, ID band Patient awake    Reviewed: Allergy & Precautions, NPO status , Patient's Chart, lab work & pertinent test results  Airway Mallampati: III  TM Distance: >3 FB   Mouth opening: Limited Mouth Opening  Dental  (+) Dental Advisory Given, Teeth Intact   Pulmonary Current Smoker and Patient abstained from smoking.,    Pulmonary exam normal breath sounds clear to auscultation       Cardiovascular Exercise Tolerance: Good negative cardio ROS Normal cardiovascular exam Rhythm:Regular Rate:Normal     Neuro/Psych CVA (craniotomy) negative psych ROS   GI/Hepatic negative GI ROS, Neg liver ROS,   Endo/Other  negative endocrine ROS  Renal/GU negative Renal ROS  negative genitourinary   Musculoskeletal negative musculoskeletal ROS (+)   Abdominal   Peds negative pediatric ROS (+)  Hematology negative hematology ROS (+)   Anesthesia Other Findings   Reproductive/Obstetrics negative OB ROS                             Anesthesia Physical  Anesthesia Plan  ASA: 2  Anesthesia Plan: General   Post-op Pain Management: Dilaudid IV   Induction: Intravenous  PONV Risk Score and Plan: 3 and Ondansetron and Dexamethasone  Airway Management Planned: LMA  Additional Equipment:   Intra-op Plan:   Post-operative Plan: Extubation in OR  Informed Consent: I have reviewed the patients History and Physical, chart, labs and discussed the procedure including the risks, benefits and alternatives for the proposed anesthesia with the patient or authorized representative who has indicated his/her understanding and acceptance.     Dental advisory given  Plan Discussed with: CRNA and Surgeon  Anesthesia Plan Comments:         Anesthesia Quick Evaluation                                  Anesthesia Evaluation  Patient identified by  MRN, date of birth, ID band Patient awake    Reviewed: Allergy & Precautions, NPO status , Patient's Chart, lab work & pertinent test results  Airway Mallampati: III  TM Distance: >3 FB   Mouth opening: Limited Mouth Opening  Dental  (+) Dental Advisory Given, Teeth Intact   Pulmonary Current Smoker and Patient abstained from smoking.,    Pulmonary exam normal breath sounds clear to auscultation       Cardiovascular Exercise Tolerance: Good negative cardio ROS Normal cardiovascular exam Rhythm:Regular Rate:Normal     Neuro/Psych CVA (craniotomy) negative psych ROS   GI/Hepatic negative GI ROS, Neg liver ROS,   Endo/Other  negative endocrine ROS  Renal/GU negative Renal ROS  negative genitourinary   Musculoskeletal negative musculoskeletal ROS (+)   Abdominal   Peds negative pediatric ROS (+)  Hematology negative hematology ROS (+)   Anesthesia Other Findings   Reproductive/Obstetrics negative OB ROS                            Anesthesia Physical Anesthesia Plan  ASA: 2  Anesthesia Plan: General   Post-op Pain Management: Dilaudid IV   Induction:   PONV Risk Score and Plan: 3 and Ondansetron and Dexamethasone  Airway Management Planned: LMA  Additional Equipment:   Intra-op Plan:   Post-operative Plan: Extubation in OR  Informed Consent: I have reviewed the patients History  and Physical, chart, labs and discussed the procedure including the risks, benefits and alternatives for the proposed anesthesia with the patient or authorized representative who has indicated his/her understanding and acceptance.     Dental advisory given  Plan Discussed with: CRNA and Surgeon  Anesthesia Plan Comments:         Anesthesia Quick Evaluation

## 2022-01-17 NOTE — Anesthesia Procedure Notes (Signed)
Procedure Name: Intubation Date/Time: 01/17/2022 8:41 AM  Performed by: Myna Bright, CRNAPre-anesthesia Checklist: Patient identified, Emergency Drugs available, Suction available and Patient being monitored Patient Re-evaluated:Patient Re-evaluated prior to induction Oxygen Delivery Method: Circle system utilized Preoxygenation: Pre-oxygenation with 100% oxygen Induction Type: IV induction Ventilation: Mask ventilation without difficulty Laryngoscope Size: Mac and 3 Grade View: Grade II Tube type: Oral Tube size: 7.0 mm Number of attempts: 1 Airway Equipment and Method: Stylet Placement Confirmation: ETT inserted through vocal cords under direct vision, positive ETCO2 and breath sounds checked- equal and bilateral Secured at: 21 cm Tube secured with: Tape Dental Injury: Teeth and Oropharynx as per pre-operative assessment  Comments: See quick note - attempted LMA - converted to ETT

## 2022-01-17 NOTE — Transfer of Care (Signed)
Immediate Anesthesia Transfer of Care Note  Patient: Katrina Harvey  Procedure(s) Performed: MASTECTOMY PARTIAL (Right: Breast)  Patient Location: PACU  Anesthesia Type:General  Level of Consciousness: sedated, patient cooperative and responds to stimulation  Airway & Oxygen Therapy: Patient Spontanous Breathing and Patient connected to face mask oxygen  Post-op Assessment: Report given to RN, Post -op Vital signs reviewed and stable and Patient moving all extremities  Post vital signs: Reviewed and stable  Last Vitals:  Vitals Value Taken Time  BP 137/69 01/17/22 0931  Temp    Pulse 64 01/17/22 0933  Resp 13 01/17/22 0933  SpO2 100 % 01/17/22 0933  Vitals shown include unvalidated device data.  Last Pain:  Vitals:   01/17/22 0729  PainSc: 0-No pain      Patients Stated Pain Goal: 5 (67/67/20 9470)  Complications: No notable events documented.

## 2022-01-17 NOTE — Interval H&P Note (Signed)
History and Physical Interval Note:  01/17/2022 7:18 AM  Katrina Harvey  has presented today for surgery, with the diagnosis of RIGHT BREAST CANCER.  The various methods of treatment have been discussed with the patient and family. After consideration of risks, benefits and other options for treatment, the patient has consented to  Procedure(s): MASTECTOMY PARTIAL (Right) as a surgical intervention.  The patient's history has been reviewed, patient examined, no change in status, stable for surgery.  I have reviewed the patient's chart and labs.  Questions were answered to the patient's satisfaction.     Aviva Signs

## 2022-01-17 NOTE — Anesthesia Postprocedure Evaluation (Signed)
Anesthesia Post Note  Patient: Keyara Ent  Procedure(s) Performed: MASTECTOMY PARTIAL (Right: Breast)  Patient location during evaluation: Phase II Anesthesia Type: General Level of consciousness: awake and alert and oriented Pain management: pain level controlled Vital Signs Assessment: post-procedure vital signs reviewed and stable Respiratory status: spontaneous breathing, nonlabored ventilation and respiratory function stable Cardiovascular status: blood pressure returned to baseline and stable Postop Assessment: no apparent nausea or vomiting Anesthetic complications: no   No notable events documented.   Last Vitals:  Vitals:   01/17/22 1051 01/17/22 1053  BP: 126/80   Pulse: 65   Resp: 18   Temp: 36.6 C   SpO2:  94%    Last Pain:  Vitals:   01/17/22 1053  TempSrc:   PainSc: 0-No pain                 Trella Thurmond C Aasiya Creasey

## 2022-01-19 ENCOUNTER — Encounter (HOSPITAL_COMMUNITY): Payer: Self-pay | Admitting: General Surgery

## 2022-01-19 LAB — SURGICAL PATHOLOGY

## 2022-01-24 ENCOUNTER — Inpatient Hospital Stay: Payer: Medicare HMO | Admitting: Hematology

## 2022-01-25 ENCOUNTER — Ambulatory Visit (INDEPENDENT_AMBULATORY_CARE_PROVIDER_SITE_OTHER): Payer: Medicare HMO | Admitting: General Surgery

## 2022-01-25 ENCOUNTER — Encounter: Payer: Self-pay | Admitting: General Surgery

## 2022-01-25 VITALS — BP 104/70 | HR 74 | Temp 98.4°F | Resp 16 | Ht 63.0 in | Wt 203.0 lb

## 2022-01-25 DIAGNOSIS — Z09 Encounter for follow-up examination after completed treatment for conditions other than malignant neoplasm: Secondary | ICD-10-CM

## 2022-01-26 NOTE — Progress Notes (Signed)
Subjective:     Katrina Harvey  Here for postop visit s/p partial mastectomy.  Doing well. Objective:    BP 104/70   Pulse 74   Temp 98.4 F (36.9 C) (Oral)   Resp 16   Ht '5\' 3"'$  (1.6 m)   Wt 203 lb (92.1 kg)   LMP  (LMP Unknown)   SpO2 93%   BMI 35.96 kg/m   General:  alert, cooperative, and no distress  Right breast incision healing well.  Final pathology shows no residual cancer.     Assessment:    Doing well postoperatively.    Plan:   Follow up with Dr. Delton Coombes as previously scheduled.  May resume normal activity.

## 2022-02-07 DIAGNOSIS — L237 Allergic contact dermatitis due to plants, except food: Secondary | ICD-10-CM | POA: Diagnosis not present

## 2022-03-02 ENCOUNTER — Inpatient Hospital Stay: Payer: Medicare HMO | Admitting: Hematology

## 2022-04-07 ENCOUNTER — Inpatient Hospital Stay: Payer: Medicare HMO | Attending: Hematology | Admitting: Hematology

## 2022-04-07 VITALS — BP 111/80 | HR 76 | Temp 98.5°F | Resp 17 | Ht 63.0 in | Wt 208.2 lb

## 2022-04-07 DIAGNOSIS — K5900 Constipation, unspecified: Secondary | ICD-10-CM | POA: Insufficient documentation

## 2022-04-07 DIAGNOSIS — D0511 Intraductal carcinoma in situ of right breast: Secondary | ICD-10-CM | POA: Insufficient documentation

## 2022-04-07 DIAGNOSIS — F17210 Nicotine dependence, cigarettes, uncomplicated: Secondary | ICD-10-CM | POA: Insufficient documentation

## 2022-04-07 DIAGNOSIS — Z9049 Acquired absence of other specified parts of digestive tract: Secondary | ICD-10-CM | POA: Insufficient documentation

## 2022-04-07 DIAGNOSIS — F32A Depression, unspecified: Secondary | ICD-10-CM | POA: Insufficient documentation

## 2022-04-07 DIAGNOSIS — Z79899 Other long term (current) drug therapy: Secondary | ICD-10-CM | POA: Diagnosis not present

## 2022-04-07 MED ORDER — TAMOXIFEN CITRATE 20 MG PO TABS: 20.0000 mg | ORAL_TABLET | Freq: Every day | ORAL | 4 refills | Status: DC

## 2022-04-07 NOTE — Progress Notes (Signed)
Referral to [facility].  Records faxed on 11/2

## 2022-04-07 NOTE — Patient Instructions (Addendum)
[place] at [facility] Discharge Instructions   You were seen and examined today by Dr. Delton Coombes.  He discussed the results of your pathology from which shows DCIS. This increases your risk of developing cancer.   We will put you on an estrogen blocking pill called Tamoxifen. You take one pill a day for 5 years. The DCIS you had feeds on estrogen, so that is the purpose of taking this pill.   This pill can cause side effects that mimic menopause. This can also cause thickening of the lining of the uterus. You need to have a PAP smear every year while you are taking Tamoxifen.   Increase your Vitamin D to 3000 units per day.   We will also make a referral to radiation oncology so you can talk to them about radiation treatments.   Return as scheduled in 4 months. We will repeat lab work one week prior to your next visit.    Thank you for choosing [place] at [facility] to provide your oncology and hematology care.  To afford each patient quality time with our provider, please arrive at least 15 minutes before your scheduled appointment time.   If you have a lab appointment with the [facility] please come in thru the Main Entrance and check in at the main information desk.  You need to re-schedule your appointment should you arrive 10 or more minutes late.  We strive to give you quality time with our providers, and arriving late affects you and other patients whose appointments are after yours.  Also, if you no show three or more times for appointments you may be dismissed from the clinic at the providers discretion.     Again, thank you for choosing [facility].  Our hope is that these requests will decrease the amount of time that you wait before being seen by our physicians.       _____________________________________________________________  Should you have questions after your visit to [facility], please  contact our office at [phone] and follow the prompts.  Our office hours are 8:00 a.m. and 4:30 p.m. Monday - Friday.  Please note that voicemails left after 4:00 p.m. may not be returned until the following business day.  We are closed weekends and major holidays.  You do have access to a nurse 24-7, just call the main number to the clinic [phone] and do not press any options, hold on the line and a nurse will answer the phone.    For prescription refill requests, have your pharmacy contact our office and allow 72 hours.    Due to Covid, you will need to wear a mask upon entering the hospital. If you do not have a mask, a mask will be given to you at the Main Entrance upon arrival. For doctor visits, patients may have 1 support person age 34 or older with them. For treatment visits, patients can not have anyone with them due to social distancing guidelines and our immunocompromised population.

## 2022-04-07 NOTE — Progress Notes (Signed)
[address], [place] [address]   CLINIC:  Medical Oncology/Hematology  PCP:  Monico Blitz, [place] [address] [phone]   REASON FOR VISIT:  Follow-up for right breast DCIS.  PRIOR THERAPY: Lumpectomy and SLNB on 12/24/2021 Reexcision of close margins on 01/17/2022  NGS Results: None  CURRENT THERAPY: Tamoxifen  BRIEF ONCOLOGIC HISTORY:  Oncology History   No history exists.    CANCER STAGING: Cancer Staging  No matching staging information was found for the patient.   INTERVAL HISTORY:  Katrina Harvey 51 y.o. female seen for follow-up of right breast DCIS with microinvasion.  She initially underwent lumpectomy and SLNB on 12/24/2021 followed by reexcision of close margins on 01/17/2022.  She has recovered well from surgery.    REVIEW OF SYSTEMS:  Review of Systems  Gastrointestinal:  Positive for constipation.  Psychiatric/Behavioral:  Positive for depression.   All other systems reviewed and are negative.    PAST MEDICAL/SURGICAL HISTORY:  Past Medical History:  Diagnosis Date   AVM (arteriovenous malformation)    Cancer (HCC)    breast cancer   Microscopic colitis    Past Surgical History:  Procedure Laterality Date   CHOLECYSTECTOMY     CRANIOTOMY FOR AVM  2013   MASTECTOMY, PARTIAL Right 01/17/2022   Procedure: MASTECTOMY PARTIAL;  Surgeon: Aviva Signs, MD;  Location: [facility];  Service: General;  Laterality: Right;   PARTIAL MASTECTOMY WITH AXILLARY SENTINEL LYMPH NODE BIOPSY Right 12/24/2021   Procedure: PARTIAL MASTECTOMY WITH AXILLARY SENTINEL LYMPH NODE WITH RADIO FREQUENCY TAG PLACEMENT;  Surgeon: Aviva Signs, MD;  Location: [facility];  Service: General;  Laterality: Right;     SOCIAL HISTORY:  Social History   Socioeconomic History   Marital status: Married    Spouse name: Not on file   Number of children: Not on file   Years of education: Not on file   Highest education level: Not on file   Occupational History   Not on file  Tobacco Use   Smoking status: Every Day    Types: Cigarettes   Smokeless tobacco: Never  Substance and Sexual Activity   Alcohol use: Not Currently   Drug use: Never   Sexual activity: Not on file  Other Topics Concern   Not on file  Social History Narrative   Not on file   Social Determinants of Health   Financial Resource Strain: Not on file  Food Insecurity: Not on file  Transportation Needs: Not on file  Physical Activity: Not on file  Stress: Not on file  Social Connections: Not on file  Intimate Partner Violence: Not on file    FAMILY HISTORY:  Family History  Problem Relation Age of Onset   Breast cancer Neg Hx     CURRENT MEDICATIONS:  Outpatient Encounter Medications as of 04/07/2022  Medication Sig   Cholecalciferol (VITAMIN D) 50 MCG (2000 UT) tablet Take 2,000 Units by mouth daily.   escitalopram (LEXAPRO) 10 MG tablet Take 10 mg by mouth daily.   loperamide (IMODIUM) 2 MG capsule Take 2 mg by mouth as needed for diarrhea or loose stools.   [DISCONTINUED] HYDROcodone-acetaminophen (NORCO) 5-325 MG tablet Take 1 tablet by mouth every 4 (four) hours as needed for moderate pain.   No facility-administered encounter medications on file as of 04/07/2022.    ALLERGIES:  No Known Allergies   PHYSICAL EXAM:  ECOG Performance status: 1  Vitals:   04/07/22 1121  BP:  111/80  Pulse: 76  Resp: 17  Temp: 98.5 F (36.9 C)  SpO2: 97%   Filed Weights   04/07/22 1121  Weight: 208 lb 3.2 oz (94.4 kg)   Physical Exam Vitals reviewed.  Constitutional:      Appearance: Normal appearance.  Cardiovascular:     Rate and Rhythm: Normal rate and regular rhythm.     Heart sounds: Normal heart sounds.  Pulmonary:     Effort: Pulmonary effort is normal.     Breath sounds: Normal breath sounds.  Abdominal:     Palpations: Abdomen is soft. There is no mass.  Neurological:     Mental Status: She is alert.  Psychiatric:         Mood and Affect: Mood normal.        Behavior: Behavior normal.      LABORATORY DATA:  I have reviewed the labs as listed.  CBC    Component Value Date/Time   WBC 7.3 12/14/2021 0855   RBC 5.23 (H) 12/14/2021 0855   HGB 15.0 12/14/2021 0855   HCT 46.4 (H) 12/14/2021 0855   PLT 237 12/14/2021 0855   MCV 88.7 12/14/2021 0855   MCH 28.7 12/14/2021 0855   MCHC 32.3 12/14/2021 0855   RDW 12.9 12/14/2021 0855   LYMPHSABS 2.0 12/14/2021 0855   MONOABS 0.6 12/14/2021 0855   EOSABS 0.1 12/14/2021 0855   BASOSABS 0.1 12/14/2021 0855      Latest Ref Rng & Units 12/14/2021    8:55 AM  CMP  Glucose 70 - 99 mg/dL 106   BUN 6 - 20 mg/dL 14   Creatinine 0.44 - 1.00 mg/dL 0.69   Sodium 135 - 145 mmol/L 138   Potassium 3.5 - 5.1 mmol/L 4.1   Chloride 98 - 111 mmol/L 105   CO2 22 - 32 mmol/L 28   Calcium 8.9 - 10.3 mg/dL 9.3   Total Protein 6.5 - 8.1 g/dL 8.0   Total Bilirubin 0.3 - 1.2 mg/dL 0.8   Alkaline Phos 38 - 126 U/L 103   AST 15 - 41 U/L 21   ALT 0 - 44 U/L 18     DIAGNOSTIC IMAGING:  I have independently reviewed the scans and discussed with the patient.  ASSESSMENT:  Right breast DCIS, ER positive: - Mammogram (09/16/2021): BI-RADS Category 0 with possible asymmetry with calcifications in the right breast - Diagnostic mammogram and ultrasound (10/20/2021): 1.7 cm mass with associated calcifications in the right breast at 11 o'clock position - Biopsy right breast 11:00 (10/20/2021): DCIS, moderate to high nuclear grade, solid to cribriform pattern, with comedonecrosis and calcification.  Tumor involves/4 biopsy specimens.  Invasive carcinoma not identified.  ER: 70% positive, PR 2% positive    Social/family history: - She is currently on disability.  She used to work as a Occupational psychologist.  Current active smoker, 1 pack/day for 30 years.  No chemical exposure. - Maternal uncle had cancer, type unknown to the patient.  Maternal cousin has lung cancer and another maternal  cousin has breast cancer. - LMP on 11/03/2021.  Menses have gotten irregular once every 2 to 3 months since 2022.    PLAN:  1.  Right breast DCIS, high-grade, microinvasion, ER positive: - Right lumpectomy on 12/24/2021, pathology: High-grade DCIS with comedonecrosis, focal microinvasion seen.  Tumor size is 1 mm.  pT1 MI, pN0 closest medial margin 1 mm.  0/3 lymph nodes involved. - Reexcision of close margin (01/17/2022): Benign breast tissue with fibrocystic changes including  stromal fibrosis, cystic dilatation of ducts, apocrine metaplasia, adenosis and focal epithelial hyperplasia without atypia.  Negative for carcinoma. - We talked about initiating her on tamoxifen.  We discussed side effects in detail. - We will make referral to radiation oncology. - RTC 4 months for follow-up with repeat labs.  2.  Bone health: - She is on vitamin D 2000 units daily. - Vitamin D level on 12/14/2021 was 24. - We will obtain DEXA scan results from Dr. Trena Platt office. - Increase vitamin D to 3000 units daily.      Orders placed this encounter:  No orders of the defined types were placed in this encounter.     Derek Jack, MD [place] [phone]

## 2022-04-15 DIAGNOSIS — K219 Gastro-esophageal reflux disease without esophagitis: Secondary | ICD-10-CM | POA: Diagnosis not present

## 2022-04-15 DIAGNOSIS — C44609 Unspecified malignant neoplasm of skin of left upper limb, including shoulder: Secondary | ICD-10-CM | POA: Diagnosis not present

## 2022-04-15 DIAGNOSIS — K529 Noninfective gastroenteritis and colitis, unspecified: Secondary | ICD-10-CM | POA: Diagnosis not present

## 2022-04-15 DIAGNOSIS — Z9011 Acquired absence of right breast and nipple: Secondary | ICD-10-CM | POA: Diagnosis not present

## 2022-04-15 DIAGNOSIS — F32A Depression, unspecified: Secondary | ICD-10-CM | POA: Diagnosis not present

## 2022-04-15 DIAGNOSIS — Z510 Encounter for antineoplastic radiation therapy: Secondary | ICD-10-CM | POA: Diagnosis not present

## 2022-04-15 DIAGNOSIS — C50411 Malignant neoplasm of upper-outer quadrant of right female breast: Secondary | ICD-10-CM | POA: Diagnosis not present

## 2022-04-15 DIAGNOSIS — Z170 Estrogen receptor positive status [ER+]: Secondary | ICD-10-CM | POA: Diagnosis not present

## 2022-04-20 DIAGNOSIS — Z9011 Acquired absence of right breast and nipple: Secondary | ICD-10-CM | POA: Diagnosis not present

## 2022-04-20 DIAGNOSIS — Z170 Estrogen receptor positive status [ER+]: Secondary | ICD-10-CM | POA: Diagnosis not present

## 2022-04-20 DIAGNOSIS — Z510 Encounter for antineoplastic radiation therapy: Secondary | ICD-10-CM | POA: Diagnosis not present

## 2022-04-20 DIAGNOSIS — C50411 Malignant neoplasm of upper-outer quadrant of right female breast: Secondary | ICD-10-CM | POA: Diagnosis not present

## 2022-04-21 DIAGNOSIS — Z510 Encounter for antineoplastic radiation therapy: Secondary | ICD-10-CM | POA: Diagnosis not present

## 2022-04-21 DIAGNOSIS — C50411 Malignant neoplasm of upper-outer quadrant of right female breast: Secondary | ICD-10-CM | POA: Diagnosis not present

## 2022-04-21 DIAGNOSIS — Z170 Estrogen receptor positive status [ER+]: Secondary | ICD-10-CM | POA: Diagnosis not present

## 2022-04-21 DIAGNOSIS — Z9011 Acquired absence of right breast and nipple: Secondary | ICD-10-CM | POA: Diagnosis not present

## 2022-04-22 DIAGNOSIS — S6991XA Unspecified injury of right wrist, hand and finger(s), initial encounter: Secondary | ICD-10-CM | POA: Diagnosis not present

## 2022-04-23 DIAGNOSIS — S52502A Unspecified fracture of the lower end of left radius, initial encounter for closed fracture: Secondary | ICD-10-CM | POA: Diagnosis not present

## 2022-04-23 DIAGNOSIS — S62002A Unspecified fracture of navicular [scaphoid] bone of left wrist, initial encounter for closed fracture: Secondary | ICD-10-CM | POA: Diagnosis not present

## 2022-05-02 DIAGNOSIS — S638X2A Sprain of other part of left wrist and hand, initial encounter: Secondary | ICD-10-CM | POA: Diagnosis not present

## 2022-05-02 DIAGNOSIS — Z170 Estrogen receptor positive status [ER+]: Secondary | ICD-10-CM | POA: Diagnosis not present

## 2022-05-02 DIAGNOSIS — S52502D Unspecified fracture of the lower end of left radius, subsequent encounter for closed fracture with routine healing: Secondary | ICD-10-CM | POA: Diagnosis not present

## 2022-05-02 DIAGNOSIS — C50411 Malignant neoplasm of upper-outer quadrant of right female breast: Secondary | ICD-10-CM | POA: Diagnosis not present

## 2022-05-02 DIAGNOSIS — Z510 Encounter for antineoplastic radiation therapy: Secondary | ICD-10-CM | POA: Diagnosis not present

## 2022-05-02 DIAGNOSIS — Z9011 Acquired absence of right breast and nipple: Secondary | ICD-10-CM | POA: Diagnosis not present

## 2022-05-03 ENCOUNTER — Telehealth: Payer: Self-pay | Admitting: *Deleted

## 2022-05-03 NOTE — Telephone Encounter (Signed)
Received request from [employer]~ 1- [phone]~ telephone/ [phone]~ fax.   Medical records requested: chart notes from date of service 12/24/2021   Medical Record Request ID: [id number]  Claim Number: [id number]  Records from [facility] faxed to [facility].

## 2022-05-04 DIAGNOSIS — Z510 Encounter for antineoplastic radiation therapy: Secondary | ICD-10-CM | POA: Diagnosis not present

## 2022-05-04 DIAGNOSIS — C50411 Malignant neoplasm of upper-outer quadrant of right female breast: Secondary | ICD-10-CM | POA: Diagnosis not present

## 2022-05-04 DIAGNOSIS — Z170 Estrogen receptor positive status [ER+]: Secondary | ICD-10-CM | POA: Diagnosis not present

## 2022-05-04 DIAGNOSIS — Z9011 Acquired absence of right breast and nipple: Secondary | ICD-10-CM | POA: Diagnosis not present

## 2022-05-04 NOTE — Telephone Encounter (Signed)
Received faxed confirmation.

## 2022-05-05 DIAGNOSIS — Z510 Encounter for antineoplastic radiation therapy: Secondary | ICD-10-CM | POA: Diagnosis not present

## 2022-05-05 DIAGNOSIS — Z170 Estrogen receptor positive status [ER+]: Secondary | ICD-10-CM | POA: Diagnosis not present

## 2022-05-05 DIAGNOSIS — Z9011 Acquired absence of right breast and nipple: Secondary | ICD-10-CM | POA: Diagnosis not present

## 2022-05-05 DIAGNOSIS — C50411 Malignant neoplasm of upper-outer quadrant of right female breast: Secondary | ICD-10-CM | POA: Diagnosis not present

## 2022-05-06 DIAGNOSIS — Z9011 Acquired absence of right breast and nipple: Secondary | ICD-10-CM | POA: Diagnosis not present

## 2022-05-06 DIAGNOSIS — Z510 Encounter for antineoplastic radiation therapy: Secondary | ICD-10-CM | POA: Diagnosis not present

## 2022-05-06 DIAGNOSIS — C50411 Malignant neoplasm of upper-outer quadrant of right female breast: Secondary | ICD-10-CM | POA: Diagnosis not present

## 2022-05-06 DIAGNOSIS — Z170 Estrogen receptor positive status [ER+]: Secondary | ICD-10-CM | POA: Diagnosis not present

## 2022-05-09 DIAGNOSIS — C50411 Malignant neoplasm of upper-outer quadrant of right female breast: Secondary | ICD-10-CM | POA: Diagnosis not present

## 2022-05-09 DIAGNOSIS — Z170 Estrogen receptor positive status [ER+]: Secondary | ICD-10-CM | POA: Diagnosis not present

## 2022-05-09 DIAGNOSIS — Z510 Encounter for antineoplastic radiation therapy: Secondary | ICD-10-CM | POA: Diagnosis not present

## 2022-05-09 DIAGNOSIS — Z9011 Acquired absence of right breast and nipple: Secondary | ICD-10-CM | POA: Diagnosis not present

## 2022-05-10 DIAGNOSIS — Z9011 Acquired absence of right breast and nipple: Secondary | ICD-10-CM | POA: Diagnosis not present

## 2022-05-10 DIAGNOSIS — Z510 Encounter for antineoplastic radiation therapy: Secondary | ICD-10-CM | POA: Diagnosis not present

## 2022-05-10 DIAGNOSIS — C50411 Malignant neoplasm of upper-outer quadrant of right female breast: Secondary | ICD-10-CM | POA: Diagnosis not present

## 2022-05-10 DIAGNOSIS — Z170 Estrogen receptor positive status [ER+]: Secondary | ICD-10-CM | POA: Diagnosis not present

## 2022-05-11 DIAGNOSIS — Z170 Estrogen receptor positive status [ER+]: Secondary | ICD-10-CM | POA: Diagnosis not present

## 2022-05-11 DIAGNOSIS — Z9011 Acquired absence of right breast and nipple: Secondary | ICD-10-CM | POA: Diagnosis not present

## 2022-05-11 DIAGNOSIS — M25532 Pain in left wrist: Secondary | ICD-10-CM | POA: Diagnosis not present

## 2022-05-11 DIAGNOSIS — C50411 Malignant neoplasm of upper-outer quadrant of right female breast: Secondary | ICD-10-CM | POA: Diagnosis not present

## 2022-05-11 DIAGNOSIS — Z510 Encounter for antineoplastic radiation therapy: Secondary | ICD-10-CM | POA: Diagnosis not present

## 2022-05-12 DIAGNOSIS — Z170 Estrogen receptor positive status [ER+]: Secondary | ICD-10-CM | POA: Diagnosis not present

## 2022-05-12 DIAGNOSIS — Z9011 Acquired absence of right breast and nipple: Secondary | ICD-10-CM | POA: Diagnosis not present

## 2022-05-12 DIAGNOSIS — Z510 Encounter for antineoplastic radiation therapy: Secondary | ICD-10-CM | POA: Diagnosis not present

## 2022-05-12 DIAGNOSIS — C50411 Malignant neoplasm of upper-outer quadrant of right female breast: Secondary | ICD-10-CM | POA: Diagnosis not present

## 2022-05-13 DIAGNOSIS — Z9011 Acquired absence of right breast and nipple: Secondary | ICD-10-CM | POA: Diagnosis not present

## 2022-05-13 DIAGNOSIS — Z170 Estrogen receptor positive status [ER+]: Secondary | ICD-10-CM | POA: Diagnosis not present

## 2022-05-13 DIAGNOSIS — Z510 Encounter for antineoplastic radiation therapy: Secondary | ICD-10-CM | POA: Diagnosis not present

## 2022-05-13 DIAGNOSIS — C50411 Malignant neoplasm of upper-outer quadrant of right female breast: Secondary | ICD-10-CM | POA: Diagnosis not present

## 2022-05-16 DIAGNOSIS — Z170 Estrogen receptor positive status [ER+]: Secondary | ICD-10-CM | POA: Diagnosis not present

## 2022-05-16 DIAGNOSIS — Z510 Encounter for antineoplastic radiation therapy: Secondary | ICD-10-CM | POA: Diagnosis not present

## 2022-05-16 DIAGNOSIS — Z9011 Acquired absence of right breast and nipple: Secondary | ICD-10-CM | POA: Diagnosis not present

## 2022-05-16 DIAGNOSIS — S52502A Unspecified fracture of the lower end of left radius, initial encounter for closed fracture: Secondary | ICD-10-CM | POA: Diagnosis not present

## 2022-05-16 DIAGNOSIS — C50411 Malignant neoplasm of upper-outer quadrant of right female breast: Secondary | ICD-10-CM | POA: Diagnosis not present

## 2022-05-17 DIAGNOSIS — Z510 Encounter for antineoplastic radiation therapy: Secondary | ICD-10-CM | POA: Diagnosis not present

## 2022-05-17 DIAGNOSIS — Z170 Estrogen receptor positive status [ER+]: Secondary | ICD-10-CM | POA: Diagnosis not present

## 2022-05-17 DIAGNOSIS — C50411 Malignant neoplasm of upper-outer quadrant of right female breast: Secondary | ICD-10-CM | POA: Diagnosis not present

## 2022-05-17 DIAGNOSIS — Z9011 Acquired absence of right breast and nipple: Secondary | ICD-10-CM | POA: Diagnosis not present

## 2022-05-18 DIAGNOSIS — Z510 Encounter for antineoplastic radiation therapy: Secondary | ICD-10-CM | POA: Diagnosis not present

## 2022-05-18 DIAGNOSIS — C50411 Malignant neoplasm of upper-outer quadrant of right female breast: Secondary | ICD-10-CM | POA: Diagnosis not present

## 2022-05-18 DIAGNOSIS — Z9011 Acquired absence of right breast and nipple: Secondary | ICD-10-CM | POA: Diagnosis not present

## 2022-05-18 DIAGNOSIS — Z170 Estrogen receptor positive status [ER+]: Secondary | ICD-10-CM | POA: Diagnosis not present

## 2022-05-19 DIAGNOSIS — Z170 Estrogen receptor positive status [ER+]: Secondary | ICD-10-CM | POA: Diagnosis not present

## 2022-05-19 DIAGNOSIS — Z510 Encounter for antineoplastic radiation therapy: Secondary | ICD-10-CM | POA: Diagnosis not present

## 2022-05-19 DIAGNOSIS — Z9011 Acquired absence of right breast and nipple: Secondary | ICD-10-CM | POA: Diagnosis not present

## 2022-05-19 DIAGNOSIS — C50411 Malignant neoplasm of upper-outer quadrant of right female breast: Secondary | ICD-10-CM | POA: Diagnosis not present

## 2022-05-20 DIAGNOSIS — C50411 Malignant neoplasm of upper-outer quadrant of right female breast: Secondary | ICD-10-CM | POA: Diagnosis not present

## 2022-05-20 DIAGNOSIS — Z170 Estrogen receptor positive status [ER+]: Secondary | ICD-10-CM | POA: Diagnosis not present

## 2022-05-20 DIAGNOSIS — Z510 Encounter for antineoplastic radiation therapy: Secondary | ICD-10-CM | POA: Diagnosis not present

## 2022-05-20 DIAGNOSIS — Z9011 Acquired absence of right breast and nipple: Secondary | ICD-10-CM | POA: Diagnosis not present

## 2022-05-23 DIAGNOSIS — Z510 Encounter for antineoplastic radiation therapy: Secondary | ICD-10-CM | POA: Diagnosis not present

## 2022-05-23 DIAGNOSIS — Z9011 Acquired absence of right breast and nipple: Secondary | ICD-10-CM | POA: Diagnosis not present

## 2022-05-23 DIAGNOSIS — C50411 Malignant neoplasm of upper-outer quadrant of right female breast: Secondary | ICD-10-CM | POA: Diagnosis not present

## 2022-05-23 DIAGNOSIS — Z170 Estrogen receptor positive status [ER+]: Secondary | ICD-10-CM | POA: Diagnosis not present

## 2022-05-24 DIAGNOSIS — Z170 Estrogen receptor positive status [ER+]: Secondary | ICD-10-CM | POA: Diagnosis not present

## 2022-05-24 DIAGNOSIS — Z9011 Acquired absence of right breast and nipple: Secondary | ICD-10-CM | POA: Diagnosis not present

## 2022-05-24 DIAGNOSIS — Z510 Encounter for antineoplastic radiation therapy: Secondary | ICD-10-CM | POA: Diagnosis not present

## 2022-05-24 DIAGNOSIS — C50411 Malignant neoplasm of upper-outer quadrant of right female breast: Secondary | ICD-10-CM | POA: Diagnosis not present

## 2022-05-25 DIAGNOSIS — Z510 Encounter for antineoplastic radiation therapy: Secondary | ICD-10-CM | POA: Diagnosis not present

## 2022-05-25 DIAGNOSIS — C50411 Malignant neoplasm of upper-outer quadrant of right female breast: Secondary | ICD-10-CM | POA: Diagnosis not present

## 2022-05-25 DIAGNOSIS — Z170 Estrogen receptor positive status [ER+]: Secondary | ICD-10-CM | POA: Diagnosis not present

## 2022-05-25 DIAGNOSIS — Z9011 Acquired absence of right breast and nipple: Secondary | ICD-10-CM | POA: Diagnosis not present

## 2022-05-26 DIAGNOSIS — Z510 Encounter for antineoplastic radiation therapy: Secondary | ICD-10-CM | POA: Diagnosis not present

## 2022-05-26 DIAGNOSIS — Z9011 Acquired absence of right breast and nipple: Secondary | ICD-10-CM | POA: Diagnosis not present

## 2022-05-26 DIAGNOSIS — Z170 Estrogen receptor positive status [ER+]: Secondary | ICD-10-CM | POA: Diagnosis not present

## 2022-05-26 DIAGNOSIS — C50411 Malignant neoplasm of upper-outer quadrant of right female breast: Secondary | ICD-10-CM | POA: Diagnosis not present

## 2022-06-13 DIAGNOSIS — S52502D Unspecified fracture of the lower end of left radius, subsequent encounter for closed fracture with routine healing: Secondary | ICD-10-CM | POA: Diagnosis not present

## 2022-06-15 DIAGNOSIS — M25432 Effusion, left wrist: Secondary | ICD-10-CM | POA: Diagnosis not present

## 2022-06-15 DIAGNOSIS — M25632 Stiffness of left wrist, not elsewhere classified: Secondary | ICD-10-CM | POA: Diagnosis not present

## 2022-06-15 DIAGNOSIS — R531 Weakness: Secondary | ICD-10-CM | POA: Diagnosis not present

## 2022-06-15 DIAGNOSIS — M25532 Pain in left wrist: Secondary | ICD-10-CM | POA: Diagnosis not present

## 2022-06-16 DIAGNOSIS — M25432 Effusion, left wrist: Secondary | ICD-10-CM | POA: Diagnosis not present

## 2022-06-16 DIAGNOSIS — R531 Weakness: Secondary | ICD-10-CM | POA: Diagnosis not present

## 2022-06-16 DIAGNOSIS — M25632 Stiffness of left wrist, not elsewhere classified: Secondary | ICD-10-CM | POA: Diagnosis not present

## 2022-06-16 DIAGNOSIS — M25532 Pain in left wrist: Secondary | ICD-10-CM | POA: Diagnosis not present

## 2022-06-20 DIAGNOSIS — M25632 Stiffness of left wrist, not elsewhere classified: Secondary | ICD-10-CM | POA: Diagnosis not present

## 2022-06-20 DIAGNOSIS — M25432 Effusion, left wrist: Secondary | ICD-10-CM | POA: Diagnosis not present

## 2022-06-20 DIAGNOSIS — M25532 Pain in left wrist: Secondary | ICD-10-CM | POA: Diagnosis not present

## 2022-06-20 DIAGNOSIS — R531 Weakness: Secondary | ICD-10-CM | POA: Diagnosis not present

## 2022-06-23 DIAGNOSIS — Z170 Estrogen receptor positive status [ER+]: Secondary | ICD-10-CM | POA: Diagnosis not present

## 2022-06-23 DIAGNOSIS — C50411 Malignant neoplasm of upper-outer quadrant of right female breast: Secondary | ICD-10-CM | POA: Diagnosis not present

## 2022-06-27 DIAGNOSIS — M25632 Stiffness of left wrist, not elsewhere classified: Secondary | ICD-10-CM | POA: Diagnosis not present

## 2022-06-27 DIAGNOSIS — R531 Weakness: Secondary | ICD-10-CM | POA: Diagnosis not present

## 2022-06-27 DIAGNOSIS — M25532 Pain in left wrist: Secondary | ICD-10-CM | POA: Diagnosis not present

## 2022-06-27 DIAGNOSIS — M25432 Effusion, left wrist: Secondary | ICD-10-CM | POA: Diagnosis not present

## 2022-06-30 DIAGNOSIS — M25432 Effusion, left wrist: Secondary | ICD-10-CM | POA: Diagnosis not present

## 2022-06-30 DIAGNOSIS — M25532 Pain in left wrist: Secondary | ICD-10-CM | POA: Diagnosis not present

## 2022-06-30 DIAGNOSIS — M25632 Stiffness of left wrist, not elsewhere classified: Secondary | ICD-10-CM | POA: Diagnosis not present

## 2022-06-30 DIAGNOSIS — R531 Weakness: Secondary | ICD-10-CM | POA: Diagnosis not present

## 2022-07-04 DIAGNOSIS — M25561 Pain in right knee: Secondary | ICD-10-CM | POA: Diagnosis not present

## 2022-07-04 DIAGNOSIS — S52502D Unspecified fracture of the lower end of left radius, subsequent encounter for closed fracture with routine healing: Secondary | ICD-10-CM | POA: Diagnosis not present

## 2022-07-04 DIAGNOSIS — M1711 Unilateral primary osteoarthritis, right knee: Secondary | ICD-10-CM | POA: Diagnosis not present

## 2022-07-29 DIAGNOSIS — F17210 Nicotine dependence, cigarettes, uncomplicated: Secondary | ICD-10-CM | POA: Diagnosis not present

## 2022-07-29 DIAGNOSIS — Z299 Encounter for prophylactic measures, unspecified: Secondary | ICD-10-CM | POA: Diagnosis not present

## 2022-07-29 DIAGNOSIS — Z1339 Encounter for screening examination for other mental health and behavioral disorders: Secondary | ICD-10-CM | POA: Diagnosis not present

## 2022-07-29 DIAGNOSIS — D849 Immunodeficiency, unspecified: Secondary | ICD-10-CM | POA: Diagnosis not present

## 2022-07-29 DIAGNOSIS — Z7189 Other specified counseling: Secondary | ICD-10-CM | POA: Diagnosis not present

## 2022-07-29 DIAGNOSIS — R5383 Other fatigue: Secondary | ICD-10-CM | POA: Diagnosis not present

## 2022-07-29 DIAGNOSIS — Z79899 Other long term (current) drug therapy: Secondary | ICD-10-CM | POA: Diagnosis not present

## 2022-07-29 DIAGNOSIS — Z0000 Encounter for general adult medical examination without abnormal findings: Secondary | ICD-10-CM | POA: Diagnosis not present

## 2022-07-29 DIAGNOSIS — Z1331 Encounter for screening for depression: Secondary | ICD-10-CM | POA: Diagnosis not present

## 2022-07-29 DIAGNOSIS — F410 Panic disorder [episodic paroxysmal anxiety] without agoraphobia: Secondary | ICD-10-CM | POA: Diagnosis not present

## 2022-07-29 DIAGNOSIS — E559 Vitamin D deficiency, unspecified: Secondary | ICD-10-CM | POA: Diagnosis not present

## 2022-08-11 ENCOUNTER — Inpatient Hospital Stay: Payer: Medicare HMO | Attending: Hematology

## 2022-08-11 DIAGNOSIS — D0511 Intraductal carcinoma in situ of right breast: Secondary | ICD-10-CM | POA: Diagnosis not present

## 2022-08-11 DIAGNOSIS — Z170 Estrogen receptor positive status [ER+]: Secondary | ICD-10-CM | POA: Diagnosis not present

## 2022-08-11 DIAGNOSIS — Z923 Personal history of irradiation: Secondary | ICD-10-CM | POA: Insufficient documentation

## 2022-08-11 DIAGNOSIS — Z79899 Other long term (current) drug therapy: Secondary | ICD-10-CM | POA: Diagnosis not present

## 2022-08-11 DIAGNOSIS — Z79810 Long term (current) use of selective estrogen receptor modulators (SERMs): Secondary | ICD-10-CM | POA: Diagnosis not present

## 2022-08-11 DIAGNOSIS — F17210 Nicotine dependence, cigarettes, uncomplicated: Secondary | ICD-10-CM | POA: Insufficient documentation

## 2022-08-11 DIAGNOSIS — Z9049 Acquired absence of other specified parts of digestive tract: Secondary | ICD-10-CM | POA: Diagnosis not present

## 2022-08-11 LAB — CBC WITH DIFFERENTIAL/PLATELET
Abs Immature Granulocytes: 0.01 10*3/uL (ref 0.00–0.07)
Basophils Absolute: 0 10*3/uL (ref 0.0–0.1)
Basophils Relative: 1 %
Eosinophils Absolute: 0.1 10*3/uL (ref 0.0–0.5)
Eosinophils Relative: 1 %
HCT: 38.7 % (ref 36.0–46.0)
Hemoglobin: 13 g/dL (ref 12.0–15.0)
Immature Granulocytes: 0 %
Lymphocytes Relative: 28 %
Lymphs Abs: 1.6 10*3/uL (ref 0.7–4.0)
MCH: 29.5 pg (ref 26.0–34.0)
MCHC: 33.6 g/dL (ref 30.0–36.0)
MCV: 88 fL (ref 80.0–100.0)
Monocytes Absolute: 0.5 10*3/uL (ref 0.1–1.0)
Monocytes Relative: 9 %
Neutro Abs: 3.5 10*3/uL (ref 1.7–7.7)
Neutrophils Relative %: 61 %
Platelets: 175 10*3/uL (ref 150–400)
RBC: 4.4 MIL/uL (ref 3.87–5.11)
RDW: 12.5 % (ref 11.5–15.5)
WBC: 5.6 10*3/uL (ref 4.0–10.5)
nRBC: 0 % (ref 0.0–0.2)

## 2022-08-11 LAB — COMPREHENSIVE METABOLIC PANEL
ALT: 19 U/L (ref 0–44)
AST: 21 U/L (ref 15–41)
Albumin: 3.5 g/dL (ref 3.5–5.0)
Alkaline Phosphatase: 80 U/L (ref 38–126)
Anion gap: 8 (ref 5–15)
BUN: 13 mg/dL (ref 6–20)
CO2: 25 mmol/L (ref 22–32)
Calcium: 8.9 mg/dL (ref 8.9–10.3)
Chloride: 103 mmol/L (ref 98–111)
Creatinine, Ser: 0.64 mg/dL (ref 0.44–1.00)
GFR, Estimated: >60 mL/min (ref >60)
Glucose, Bld: 85 mg/dL (ref 70–99)
Potassium: 3.8 mmol/L (ref 3.5–5.1)
Sodium: 136 mmol/L (ref 135–145)
Total Bilirubin: 0.4 mg/dL (ref 0.3–1.2)
Total Protein: 6.8 g/dL (ref 6.5–8.1)

## 2022-08-11 LAB — VITAMIN D 25 HYDROXY (VIT D DEFICIENCY, FRACTURES): Vit D, 25-Hydroxy: 37.06 ng/mL (ref 30–100)

## 2022-08-18 ENCOUNTER — Inpatient Hospital Stay: Payer: Medicare HMO | Admitting: Physician Assistant

## 2022-08-18 VITALS — BP 106/69 | HR 80 | Temp 98.1°F | Resp 16 | Wt 216.5 lb

## 2022-08-18 DIAGNOSIS — Z79899 Other long term (current) drug therapy: Secondary | ICD-10-CM | POA: Diagnosis not present

## 2022-08-18 DIAGNOSIS — D0511 Intraductal carcinoma in situ of right breast: Secondary | ICD-10-CM

## 2022-08-18 DIAGNOSIS — Z170 Estrogen receptor positive status [ER+]: Secondary | ICD-10-CM | POA: Diagnosis not present

## 2022-08-18 DIAGNOSIS — Z79810 Long term (current) use of selective estrogen receptor modulators (SERMs): Secondary | ICD-10-CM | POA: Diagnosis not present

## 2022-08-18 DIAGNOSIS — Z923 Personal history of irradiation: Secondary | ICD-10-CM | POA: Diagnosis not present

## 2022-08-18 DIAGNOSIS — Z9049 Acquired absence of other specified parts of digestive tract: Secondary | ICD-10-CM | POA: Diagnosis not present

## 2022-08-18 DIAGNOSIS — F17210 Nicotine dependence, cigarettes, uncomplicated: Secondary | ICD-10-CM | POA: Diagnosis not present

## 2022-08-18 NOTE — Progress Notes (Signed)
[place], [place] [address]   CLINIC:  Medical Oncology/Hematology  PCP:  Monico Blitz, [place] [address] [phone]   REASON FOR VISIT:  Follow-up for right breast DCIS.  PRIOR THERAPY:  -Lumpectomy and SLNB on 12/24/2021 -Reexcision of close margins on 01/17/2022 -Received adjuvant radiation to the right breast from 05/05/2022-05/26/2022  NGS Results: None  CURRENT THERAPY: Tamoxifen   CANCER STAGING:  Cancer Staging  No matching staging information was found for the patient.   INTERVAL HISTORY:  Katrina Harvey 52 y.o. female seen for follow-up of right breast DCIS with microinvasion.  She was last seen by Dr. Delton Coombes on 04/07/2022. In the interim, she completed adjuvant radiation therapy and continues on Tamoxifen therapy. She is tolerating Tamoxifen therapy without any toxicities except for feeling warm at night. She reports that she has recovered well from radiation. She denies any breast discomfort  at this time. Her energy levels are back to baseline. She is able to complete her ADLs on her own. She denies any appetite or weight loss. She denies nausea, vomiting or abdominal pain. Her bowel habits are unchanged with intermittent episodes of diarrhea or constipation. She denies easy bruising or signs of bleeding. She denies fevers, chills, sweat, shortness of breath, chest pain or cough. She has no other complaints.     REVIEW OF SYSTEMS:  Review of Systems  Constitutional:  Negative for fatigue and unexpected weight change.  Respiratory:  Negative for cough and shortness of breath.   Cardiovascular:  Negative for chest pain.  Gastrointestinal:  Positive for constipation and diarrhea. Negative for abdominal pain.  Skin:  Negative for itching and rash.  All other systems reviewed and are negative.    PAST MEDICAL/SURGICAL HISTORY:  Past Medical History:  Diagnosis Date   AVM (arteriovenous malformation)     Cancer (HCC)    breast cancer   Microscopic colitis    Past Surgical History:  Procedure Laterality Date   CHOLECYSTECTOMY     CRANIOTOMY FOR AVM  2013   MASTECTOMY, PARTIAL Right 01/17/2022   Procedure: MASTECTOMY PARTIAL;  Surgeon: Aviva Signs, MD;  Location: [facility];  Service: General;  Laterality: Right;   PARTIAL MASTECTOMY WITH AXILLARY SENTINEL LYMPH NODE BIOPSY Right 12/24/2021   Procedure: PARTIAL MASTECTOMY WITH AXILLARY SENTINEL LYMPH NODE WITH RADIO FREQUENCY TAG PLACEMENT;  Surgeon: Aviva Signs, MD;  Location: [facility];  Service: General;  Laterality: Right;     SOCIAL HISTORY:  Social History   Socioeconomic History   Marital status: Married    Spouse name: Not on file   Number of children: Not on file   Years of education: Not on file   Highest education level: Not on file  Occupational History   Not on file  Tobacco Use   Smoking status: Every Day    Types: Cigarettes   Smokeless tobacco: Never  Substance and Sexual Activity   Alcohol use: Not Currently   Drug use: Never   Sexual activity: Not on file  Other Topics Concern   Not on file  Social History Narrative   Not on file   Social Determinants of Health   Financial Resource Strain: Not on file  Food Insecurity: Not on file  Transportation Needs: Not on file  Physical Activity: Not on file  Stress: Not on file  Social Connections: Not on file  Intimate Partner Violence: Not on file    FAMILY HISTORY:  Family History  Problem Relation Age of Onset   Breast cancer Neg Hx     CURRENT MEDICATIONS:  Outpatient Encounter Medications as of 08/18/2022  Medication Sig   ALPRAZolam (XANAX) 0.25 MG tablet Take 0.25 mg by mouth 2 (two) times daily as needed.   Cholecalciferol (VITAMIN D) 50 MCG (2000 UT) tablet Take 2,000 Units by mouth daily.   escitalopram (LEXAPRO) 10 MG tablet Take 10 mg by mouth daily.   loperamide (IMODIUM) 2 MG capsule Take 2 mg by mouth as needed for diarrhea or loose stools.    pantoprazole (PROTONIX) 40 MG tablet Take 40 mg by mouth daily.   tamoxifen (NOLVADEX) 20 MG tablet Take 1 tablet (20 mg total) by mouth daily.   No facility-administered encounter medications on file as of 08/18/2022.    ALLERGIES:  No Known Allergies   PHYSICAL EXAM:  ECOG Performance status: 1  Vitals:   08/18/22 1435  BP: 106/69  Pulse: 80  Resp: 16  Temp: 98.1 F (36.7 C)  SpO2: 97%   Filed Weights   08/18/22 1435  Weight: 216 lb 8 oz (98.2 kg)   Physical Exam Vitals reviewed.  Constitutional:      Appearance: Normal appearance.  Cardiovascular:     Rate and Rhythm: Normal rate and regular rhythm.     Heart sounds: Normal heart sounds.  Pulmonary:     Effort: Pulmonary effort is normal.     Breath sounds: Normal breath sounds.  Chest:     Comments: Post lumpectomy changes noted with healing scar on right breast. No palpable masses appreciated. No lymphadenopathy Abdominal:     Palpations: Abdomen is soft. There is no mass.  Neurological:     Mental Status: She is alert.  Psychiatric:        Mood and Affect: Mood normal.        Behavior: Behavior normal.      LABORATORY DATA:  I have reviewed the labs as listed.  CBC    Component Value Date/Time   WBC 5.6 08/11/2022 1408   RBC 4.40 08/11/2022 1408   HGB 13.0 08/11/2022 1408   HCT 38.7 08/11/2022 1408   PLT 175 08/11/2022 1408   MCV 88.0 08/11/2022 1408   MCH 29.5 08/11/2022 1408   MCHC 33.6 08/11/2022 1408   RDW 12.5 08/11/2022 1408   LYMPHSABS 1.6 08/11/2022 1408   MONOABS 0.5 08/11/2022 1408   EOSABS 0.1 08/11/2022 1408   BASOSABS 0.0 08/11/2022 1408      Latest Ref Rng & Units 08/11/2022    2:08 PM 12/14/2021    8:55 AM  CMP  Glucose 70 - 99 mg/dL 85  106   BUN 6 - 20 mg/dL 13  14   Creatinine 0.44 - 1.00 mg/dL 0.64  0.69   Sodium 135 - 145 mmol/L 136  138   Potassium 3.5 - 5.1 mmol/L 3.8  4.1   Chloride 98 - 111 mmol/L 103  105   CO2 22 - 32 mmol/L 25  28   Calcium 8.9 - 10.3  mg/dL 8.9  9.3   Total Protein 6.5 - 8.1 g/dL 6.8  8.0   Total Bilirubin 0.3 - 1.2 mg/dL 0.4  0.8   Alkaline Phos 38 - 126 U/L 80  103   AST 15 - 41 U/L 21  21   ALT 0 - 44 U/L 19  18     DIAGNOSTIC IMAGING:  I have independently reviewed the scans and discussed with the patient.  ASSESSMENT:  Right breast DCIS, ER positive: - Mammogram (09/16/2021): BI-RADS Category 0 with possible asymmetry with calcifications in the right breast - Diagnostic mammogram and ultrasound (10/20/2021): 1.7 cm mass with associated calcifications in the right breast at 11 o'clock position - Biopsy right breast 11:00 (10/20/2021): DCIS, moderate to high nuclear grade, solid to cribriform pattern, with comedonecrosis and calcification.  Tumor involves/4 biopsy specimens.  Invasive carcinoma not identified.  ER: 70% positive, PR 2% positive - Right lumpectomy on 12/24/2021, pathology: High-grade DCIS with comedonecrosis, focal microinvasion seen.  Tumor size is 1 mm.  pT1 MI, pN0 closest medial margin 1 mm.  0/3 lymph nodes involved. - Reexcision of close margin (01/17/2022): Benign breast tissue with fibrocystic changes including stromal fibrosis, cystic dilatation of ducts, apocrine metaplasia, adenosis and focal epithelial hyperplasia without atypia.  Negative for carcinoma. - Started Tamoxifen 20 mg daily in November 2023.  - Received adjuvant radiation to the right breast from 05/05/2022-05/26/2022   2.Social/family history: - She is currently on disability.  She used to work as a Occupational psychologist.  Current active smoker, 1 pack/day for 30 years.  No chemical exposure. - Maternal uncle had cancer, type unknown to the patient.  Maternal cousin has lung cancer and another maternal cousin has breast cancer. - LMP on 11/03/2021.  Menses have gotten irregular once every 2 to 3 months since 2022.    PLAN:  1.  Right breast DCIS, high-grade, microinvasion, ER positive: - Right lumpectomy on 12/24/2021, pathology:  High-grade DCIS with comedonecrosis, focal microinvasion seen.  Tumor size is 1 mm.  pT1 MI, pN0 closest medial margin 1 mm.  0/3 lymph nodes involved. - Reexcision of close margin (01/17/2022): Benign breast tissue with fibrocystic changes including stromal fibrosis, cystic dilatation of ducts, apocrine metaplasia, adenosis and focal epithelial hyperplasia without atypia.  Negative for carcinoma. - Started Tamoxifen 20 mg daily in November 2023.  - Received adjuvant radiation to the right breast from 05/05/2022-05/26/2022 --Labs from today reviewed and require no intervention.  --No prohibitive toxicities to continue with Tamoxifen.  --Mammogram due in April 2024, ordered today --RTC in 4 months with labs.   2.  Bone health: - Vitamin D level on 37/2024 improved to 37.06.  - She is on vitamin D 3000 units daily. Recommend to continue --Need to review her DEXA scan from Dr. Manuella Ghazi   Orders placed this encounter:  No orders of the defined types were placed in this encounter.  Patient expressed understanding and satisfaction with the plan provided.   I have spent a total of 30 minutes minutes of face-to-face and non-face-to-face time, preparing to see the patient, performing a medically appropriate examination, counseling and educating the patient, documenting clinical information in the electronic health record, independently interpreting results and communicating results to the patient, and care coordination.   Dede Query PA-C Dept of Hematology and [place]

## 2022-09-07 DIAGNOSIS — D3132 Benign neoplasm of left choroid: Secondary | ICD-10-CM | POA: Diagnosis not present

## 2022-09-07 DIAGNOSIS — H53462 Homonymous bilateral field defects, left side: Secondary | ICD-10-CM | POA: Diagnosis not present

## 2022-09-07 DIAGNOSIS — H2513 Age-related nuclear cataract, bilateral: Secondary | ICD-10-CM | POA: Diagnosis not present

## 2022-09-14 DIAGNOSIS — R21 Rash and other nonspecific skin eruption: Secondary | ICD-10-CM | POA: Diagnosis not present

## 2022-09-14 DIAGNOSIS — L237 Allergic contact dermatitis due to plants, except food: Secondary | ICD-10-CM | POA: Diagnosis not present

## 2022-10-18 ENCOUNTER — Ambulatory Visit
Admission: RE | Admit: 2022-10-18 | Discharge: 2022-10-18 | Disposition: A | Discharge status: Home / Self Care | Payer: Medicare HMO | Source: Ambulatory Visit | Attending: Physician Assistant | Admitting: Physician Assistant

## 2022-10-18 ENCOUNTER — Other Ambulatory Visit: Payer: Self-pay | Admitting: Physician Assistant

## 2022-10-18 DIAGNOSIS — D0511 Intraductal carcinoma in situ of right breast: Secondary | ICD-10-CM

## 2022-10-18 HISTORY — DX: Malignant neoplasm of unspecified site of unspecified female breast: C50.919

## 2022-11-04 IMAGING — MG MM DIGITAL SCREENING BILAT W/ TOMO AND CAD
8 series · 8 of 24 positions shown · non-contrast
Comparison: Previous exam(s).

CLINICAL DATA: Screening.

EXAM:
DIGITAL SCREENING BILATERAL MAMMOGRAM WITH TOMOSYNTHESIS AND CAD
TECHNIQUE: Bilateral screening digital craniocaudal and mediolateral oblique
mammograms were obtained. Bilateral screening digital breast
tomosynthesis was performed. The images were evaluated with
computer-aided detection.

[L MLO synth-2D]
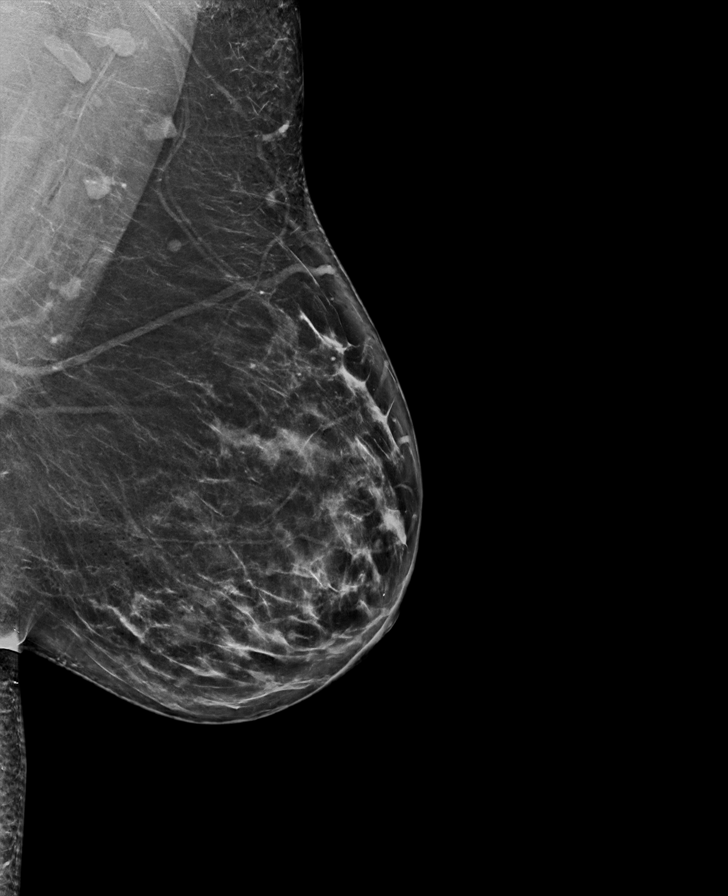

[R MLO synth-2D]
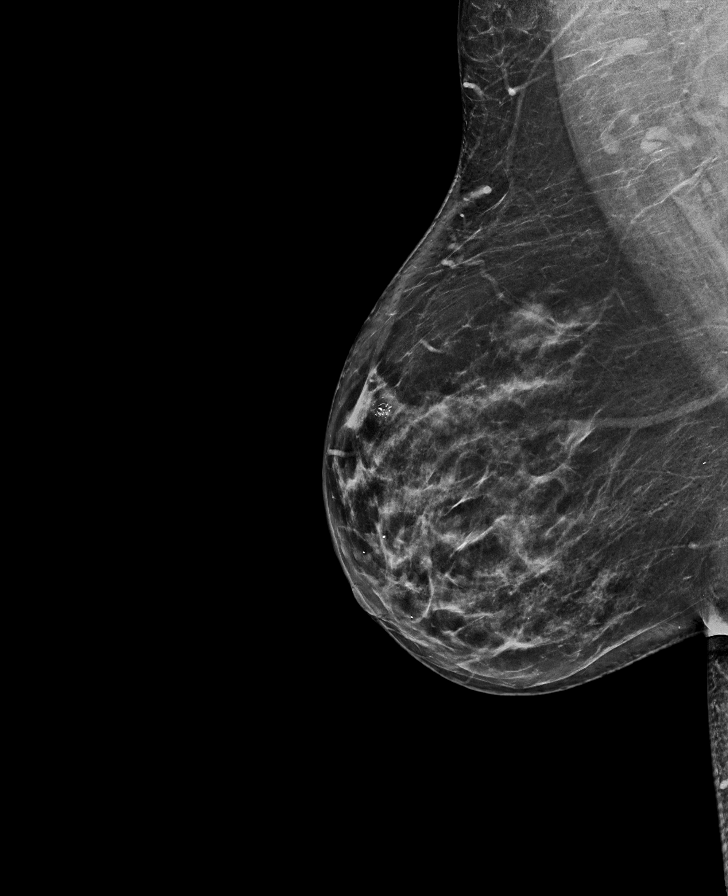

[L CC synth-2D]
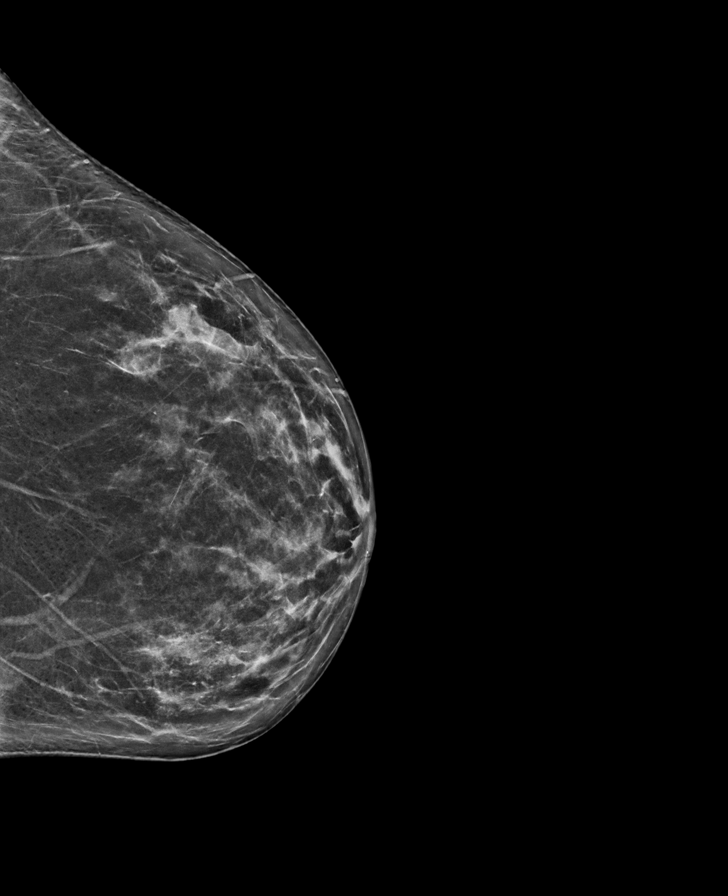

[R CC synth-2D]
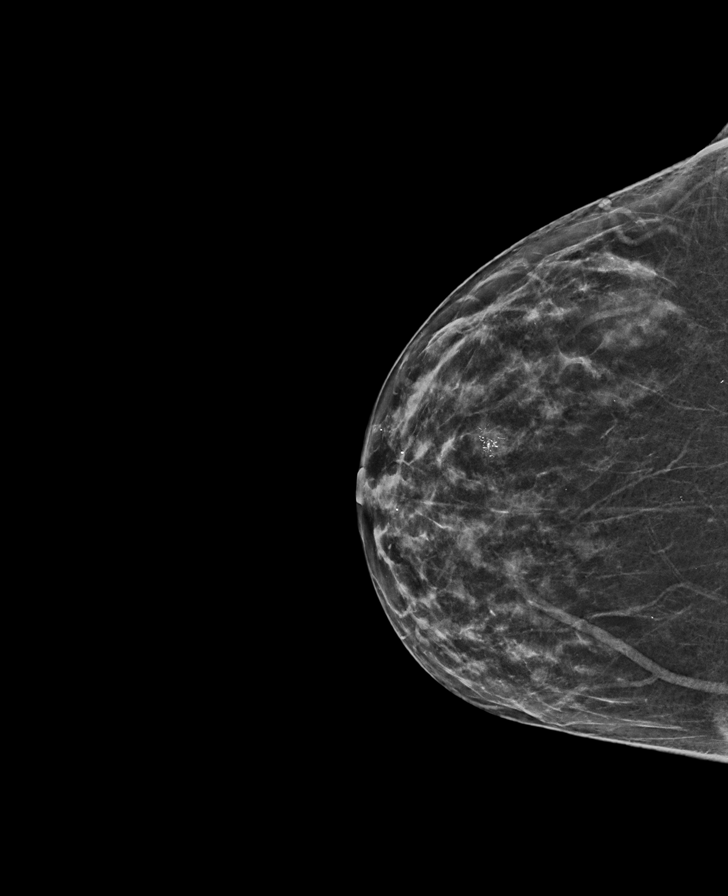

[L MLO tomo · tomo slice 45/88.0]
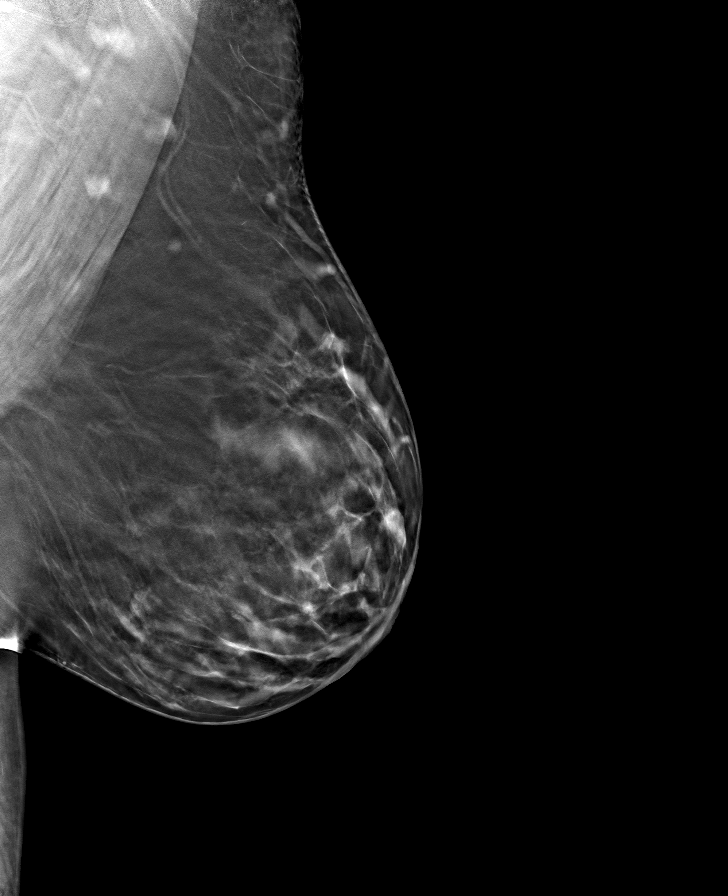

[R CC tomo · tomo slice 35/69.0]
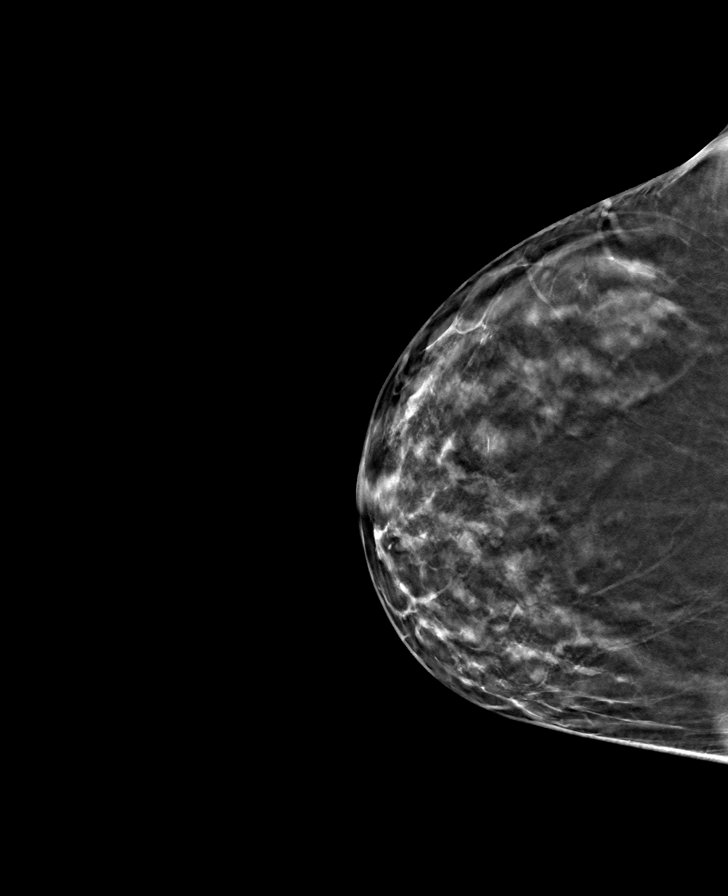

[L CC tomo · tomo slice 37/73.0]
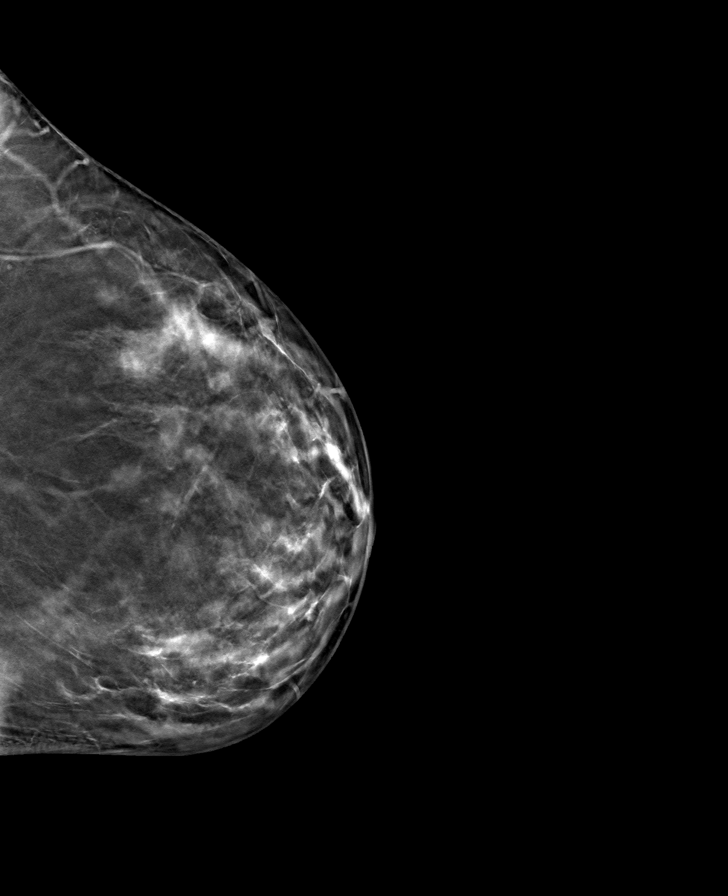

[R MLO tomo · tomo slice 41/81.0]
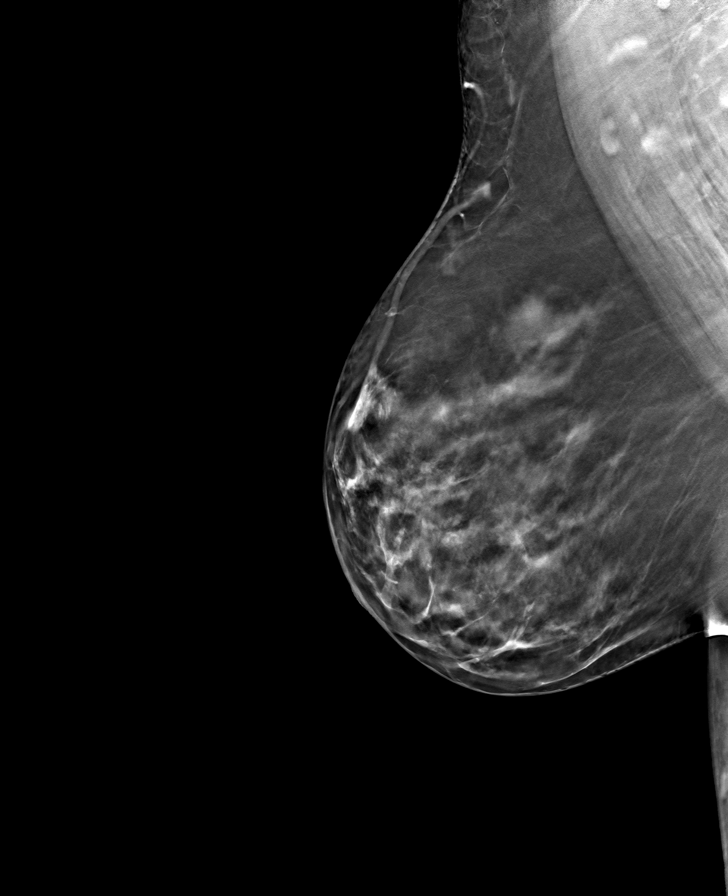

[8 of 24 positions shown; findings below may reference images not displayed]

ACR Breast Density Category c: The breast tissue is heterogeneously
dense, which may obscure small masses.
FINDINGS: In the right breast, a possible asymmetry with associated
calcifications warrants further evaluation. In the left breast, no
findings suspicious for malignancy.
IMPRESSION: Further evaluation is suggested for possible asymmetry with
associated calcifications in the right breast.

RECOMMENDATION:
Diagnostic mammogram and possibly ultrasound of the right breast.
(Code:60-[id number])

The patient will be contacted regarding the findings, and additional
imaging will be scheduled.

BI-RADS CATEGORY  0: Incomplete. Need additional imaging evaluation
and/or prior mammograms for comparison.

## 2022-11-15 DIAGNOSIS — Z0000 Encounter for general adult medical examination without abnormal findings: Secondary | ICD-10-CM | POA: Diagnosis not present

## 2022-11-15 DIAGNOSIS — Z299 Encounter for prophylactic measures, unspecified: Secondary | ICD-10-CM | POA: Diagnosis not present

## 2022-11-15 DIAGNOSIS — F339 Major depressive disorder, recurrent, unspecified: Secondary | ICD-10-CM | POA: Diagnosis not present

## 2022-11-15 DIAGNOSIS — Z01419 Encounter for gynecological examination (general) (routine) without abnormal findings: Secondary | ICD-10-CM | POA: Diagnosis not present

## 2022-11-15 DIAGNOSIS — F410 Panic disorder [episodic paroxysmal anxiety] without agoraphobia: Secondary | ICD-10-CM | POA: Diagnosis not present

## 2022-11-16 DIAGNOSIS — R92333 Mammographic heterogeneous density, bilateral breasts: Secondary | ICD-10-CM | POA: Diagnosis not present

## 2022-11-16 DIAGNOSIS — Z853 Personal history of malignant neoplasm of breast: Secondary | ICD-10-CM | POA: Diagnosis not present

## 2022-11-16 DIAGNOSIS — R234 Changes in skin texture: Secondary | ICD-10-CM | POA: Diagnosis not present

## 2022-11-16 DIAGNOSIS — Z9889 Other specified postprocedural states: Secondary | ICD-10-CM | POA: Diagnosis not present

## 2022-11-16 DIAGNOSIS — N6459 Other signs and symptoms in breast: Secondary | ICD-10-CM | POA: Diagnosis not present

## 2022-11-22 DIAGNOSIS — G4730 Sleep apnea, unspecified: Secondary | ICD-10-CM | POA: Diagnosis not present

## 2022-12-07 ENCOUNTER — Other Ambulatory Visit: Payer: Self-pay

## 2022-12-07 DIAGNOSIS — D0511 Intraductal carcinoma in situ of right breast: Secondary | ICD-10-CM

## 2022-12-12 ENCOUNTER — Inpatient Hospital Stay: Payer: Medicare HMO | Attending: Hematology

## 2022-12-12 DIAGNOSIS — I890 Lymphedema, not elsewhere classified: Secondary | ICD-10-CM | POA: Insufficient documentation

## 2022-12-12 DIAGNOSIS — D0511 Intraductal carcinoma in situ of right breast: Secondary | ICD-10-CM | POA: Insufficient documentation

## 2022-12-12 DIAGNOSIS — N6011 Diffuse cystic mastopathy of right breast: Secondary | ICD-10-CM | POA: Insufficient documentation

## 2022-12-12 DIAGNOSIS — Z170 Estrogen receptor positive status [ER+]: Secondary | ICD-10-CM | POA: Insufficient documentation

## 2022-12-12 DIAGNOSIS — R61 Generalized hyperhidrosis: Secondary | ICD-10-CM | POA: Insufficient documentation

## 2022-12-12 DIAGNOSIS — Z79899 Other long term (current) drug therapy: Secondary | ICD-10-CM | POA: Insufficient documentation

## 2022-12-12 DIAGNOSIS — Z9049 Acquired absence of other specified parts of digestive tract: Secondary | ICD-10-CM | POA: Insufficient documentation

## 2022-12-12 DIAGNOSIS — N6081 Other benign mammary dysplasias of right breast: Secondary | ICD-10-CM | POA: Insufficient documentation

## 2022-12-12 DIAGNOSIS — M79601 Pain in right arm: Secondary | ICD-10-CM | POA: Insufficient documentation

## 2022-12-12 DIAGNOSIS — F17210 Nicotine dependence, cigarettes, uncomplicated: Secondary | ICD-10-CM | POA: Insufficient documentation

## 2022-12-12 DIAGNOSIS — Z79810 Long term (current) use of selective estrogen receptor modulators (SERMs): Secondary | ICD-10-CM | POA: Insufficient documentation

## 2022-12-12 DIAGNOSIS — R232 Flushing: Secondary | ICD-10-CM | POA: Insufficient documentation

## 2022-12-13 ENCOUNTER — Other Ambulatory Visit: Payer: Self-pay

## 2022-12-13 DIAGNOSIS — D0511 Intraductal carcinoma in situ of right breast: Secondary | ICD-10-CM

## 2022-12-15 NOTE — Progress Notes (Signed)
[facility] [address], [place] [address]   CLINIC:  Medical Oncology/Hematology  PCP:  Katrina Peri, MD [address] [place] [address] [phone]   REASON FOR VISIT:  Follow-up for right breast DCIS.   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  Right lumpectomy performed on 12/24/2021.  Reexcision of close margin performed on 01/17/2022.  She received adjuvant radiation to the right breast from 05/05/2022 through 05/26/2022.  She was started on tamoxifen 20 mg daily in November 2023.  INTERVAL HISTORY:   Ms. Katrina Harvey, a 52 y.o. female, returns for routine follow-up of her history of right breast DCIS. Katrina Harvey was last seen on 08/18/2022 by Katrina Kaufmann, PA-C.   At today's visit, she  reports feeling fairly well.  She denies any recent hospitalizations, surgeries, or changes in her  baseline health status.  She reports a pulling pain and soreness in her right armpit, particularly when she raises her arm above the level of her head.  She does not have any swelling in her right arm, but does note some mild swelling in her right breast.  She denies any symptoms of recurrence such as new lumps, bone pain, chest pain, dyspnea, or abdominal pain.  She has no new headaches, seizures, or focal neurologic deficits.  No B symptoms such as fever, chills, night sweats, unintentional weight loss.  She continues to take tamoxifen and is tolerating this fairly well.  She reports mild hot flashes and night sweats, but does not feel that she needs any medication to ameliorate these at this time.  She has not had any vaginal bleeding since starting tamoxifen.  She denies any history of DVT/PE, or current  symptoms of DVT/PE.  She reports 60% energy and 100% appetite.  She is maintaining stable weight at this time.   ASSESSMENT & PLAN:  1.  Right breast DCIS, ER positive: - Mammogram (09/16/2021): BI-RADS Category 0 with possible asymmetry with calcifications in the right breast - Diagnostic mammogram and ultrasound (10/20/2021): 1.7 cm mass with associated calcifications in the right breast at 11 o'clock position - Biopsy right breast 11:00 (10/20/2021): DCIS, moderate to high nuclear grade, solid to cribriform pattern, with comedonecrosis and calcification.  Tumor involves/4 biopsy specimens.  Invasive carcinoma not identified.  ER: 70% positive, PR 2% positive - Right lumpectomy on 12/24/2021, pathology: High-grade DCIS with comedonecrosis, focal microinvasion seen.  Tumor size is 1 mm.  pT1 MI, pN0 closest medial margin 1 mm.  0/3 lymph nodes involved. - Reexcision of close margin (01/17/2022): Benign breast tissue with fibrocystic changes including stromal fibrosis, cystic dilatation of ducts, apocrine metaplasia, adenosis and focal epithelial hyperplasia without atypia.  Negative for carcinoma. - Started Tamoxifen 20 mg daily in November 2023.  She is tolerating tamoxifen well.   - Received adjuvant radiation to the right breast from 05/05/2022-05/26/2022 - Most recent mammogram (11/16/2022 via [facility]): BI-RADS Category 2, benign.  Postlumpectomy changes in upper/outer aspect of right breast.  Right breast skin thickening and mild trabecular thickening consistent with radiation-induced edema.  No new or suspicious breast masses, no areas of nonsurgical architectural distortion, and no suspicious calcifications. - Labs today (12/19/2022): Normal CBC/D.  CMP unremarkable, no prohibitive toxicities to continuing  tamoxifen. - Physical exam shows bilaterally dense breast tissue.  Otherwise, left breast within normal limits.  Right breast mildly tender, particularly on palpation of scar tissue beneath lumpectomy  scar in upper outer quadrant, and upon palpation of scar tissue in right axillary region.  No palpable lymphadenopathy. - No "red flag" symptoms of recurrence per history/ROS  - PLAN: Continue tamoxifen. - We will transition to 24-month visits at this time.  RTC 6 months with labs. - Patient provided with handout on exercises that may help with the pain and decreased mobility she is experiencing from lumpectomy/SLNB scar tissue.  Also encouraged to find a massage therapist to help with lymphedema of right breast. - Next diagnostic mammogram due June 2025.  2.  Bone health: - Bone density scan via PCP (08/06/2021): T-score -1.0, normal. - Vitamin D level on 08/11/2022 improved to 37.06. - Vitamin D today (12/19/2022) is pending - She is on vitamin D 1000 units daily. - PLAN: Continue vitamin D 1000 units daily.    3.  Social/family history: - She is currently on disability.  She used to work as a Associate Professor.  Current active smoker, 1 pack/day for 30 years.  No chemical exposure. - Maternal uncle had cancer, type unknown to the patient.  Maternal cousin has lung cancer and another maternal cousin has breast cancer. - LMP on 11/03/2021.  Menses have gotten irregular once every 2 to 3 months since 2022.  She denies any menstrual cycle since starting tamoxifen.   PLAN SUMMARY: >> Labs in 6 months = CBC/D, CMP, vitamin D >> OFFICE visit in 6 months    REVIEW OF SYSTEMS:   Review of Systems  Constitutional:  Positive for fatigue. Negative for appetite change, chills, diaphoresis, fever and unexpected weight change.  HENT:   Negative for lump/mass and nosebleeds.   Eyes:  Negative for eye problems.  Respiratory:  Negative for cough, hemoptysis and shortness of breath.   Cardiovascular:  Negative for chest pain, leg swelling and palpitations.  Gastrointestinal:  Negative for abdominal pain, blood in stool, constipation, diarrhea, nausea and vomiting.  Genitourinary:  Negative for hematuria.    Musculoskeletal:  Positive for myalgias (Right axillary pain with certain movements).  Skin: Negative.   Neurological:  Negative for dizziness, headaches and light-headedness.  Hematological:  Does not bruise/bleed easily.    PHYSICAL EXAM:   Performance status (ECOG): 1 - Symptomatic but completely ambulatory  There were no vitals filed for this visit. Wt Readings from Last 3 Encounters:  08/18/22 216 lb 8 oz (98.2 kg)  04/07/22 208 lb 3.2 oz (94.4 kg)  01/25/22 203 lb (92.1 kg)   Physical Exam Constitutional:      Appearance: Normal appearance. She is obese.  Cardiovascular:     Heart sounds: Normal heart sounds.  Pulmonary:     Breath sounds: Normal breath sounds.  Chest:  Breasts:    Right: Tenderness present.       Comments: Bilaterally dense breast tissue. LEFT breast within normal limits. RIGHT breast mildly tender, particularly on palpation of scar tissue beneath lumpectomy scar in upper outer quadrant, and upon palpation of scar tissue in right axillary region.  No palpable lymphadenopathy. Lymphadenopathy:     Upper Body:     Right upper body: No supraclavicular, axillary or pectoral adenopathy.     Left upper body: No supraclavicular, axillary or pectoral adenopathy.  Neurological:     General: No focal deficit present.     Mental Status: Mental status is  at baseline.  Psychiatric:        Behavior: Behavior normal. Behavior is cooperative.     PAST MEDICAL/SURGICAL HISTORY:  Past Medical History:  Diagnosis Date   AVM (arteriovenous malformation)    Breast cancer (HCC)    Cancer (HCC)    breast cancer   Microscopic colitis    Past Surgical History:  Procedure Laterality Date   BREAST LUMPECTOMY Right 01/17/2022   partial mastectomy   CHOLECYSTECTOMY     CRANIOTOMY FOR AVM  2013   MASTECTOMY, PARTIAL Right 01/17/2022   Procedure: MASTECTOMY PARTIAL;  Surgeon: Franky Macho, MD;  Location: [facility];  Service: General;  Laterality: Right;   PARTIAL  MASTECTOMY WITH AXILLARY SENTINEL LYMPH NODE BIOPSY Right 12/24/2021   Procedure: PARTIAL MASTECTOMY WITH AXILLARY SENTINEL LYMPH NODE WITH RADIO FREQUENCY TAG PLACEMENT;  Surgeon: Franky Macho, MD;  Location: [facility];  Service: General;  Laterality: Right;    SOCIAL HISTORY:  Social History   Socioeconomic History   Marital status: Married    Spouse name: Not on file   Number of children: Not on file   Years of education: Not on file   Highest education level: Not on file  Occupational History   Not on file  Tobacco Use   Smoking status: Every Day    Types: Cigarettes   Smokeless tobacco: Never  Substance and Sexual Activity   Alcohol use: Not Currently   Drug use: Never   Sexual activity: Not on file  Other Topics Concern   Not on file  Social History Narrative   Not on file   Social Determinants of Health   Financial Resource Strain: Not on file  Food Insecurity: Not on file  Transportation Needs: Not on file  Physical Activity: Not on file  Stress: Not on file  Social Connections: Not on file  Intimate Partner Violence: Not on file    FAMILY HISTORY:  Family History  Problem Relation Age of Onset   Breast cancer Neg Hx     CURRENT MEDICATIONS:  Current Outpatient Medications  Medication Sig Dispense Refill   ALPRAZolam (XANAX) 0.25 MG tablet Take 0.25 mg by mouth 2 (two) times daily as needed.     Cholecalciferol (VITAMIN D) 50 MCG (2000 UT) tablet Take 2,000 Units by mouth daily.     escitalopram (LEXAPRO) 10 MG tablet Take 10 mg by mouth daily.     loperamide (IMODIUM) 2 MG capsule Take 2 mg by mouth as needed for diarrhea or loose stools.     pantoprazole (PROTONIX) 40 MG tablet Take 40 mg by mouth daily.     tamoxifen (NOLVADEX) 20 MG tablet Take 1 tablet (20 mg total) by mouth daily. 90 tablet 4   No current facility-administered medications for this visit.    ALLERGIES:  No Known Allergies  LABORATORY DATA:  I have reviewed the labs as listed.      Latest Ref Rng & Units 08/11/2022    2:08 PM 12/14/2021    8:55 AM  CBC  WBC 4.0 - 10.5 K/uL 5.6  7.3   Hemoglobin 12.0 - 15.0 g/dL 16.1  09.6   Hematocrit 36.0 - 46.0 % 38.7  46.4   Platelets 150 - 400 K/uL 175  237       Latest Ref Rng & Units 08/11/2022    2:08 PM 12/14/2021    8:55 AM  CMP  Glucose 70 - 99 mg/dL 85  045   BUN 6 - 20 mg/dL 13  14   Creatinine 0.44 - 1.00 mg/dL 1.30  8.65   Sodium 784 - 145 mmol/L 136  138   Potassium 3.5 - 5.1 mmol/L 3.8  4.1   Chloride 98 - 111 mmol/L 103  105   CO2 22 - 32 mmol/L 25  28   Calcium 8.9 - 10.3 mg/dL 8.9  9.3   Total Protein 6.5 - 8.1 g/dL 6.8  8.0   Total Bilirubin 0.3 - 1.2 mg/dL 0.4  0.8   Alkaline Phos 38 - 126 U/L 80  103   AST 15 - 41 U/L 21  21   ALT 0 - 44 U/L 19  18     DIAGNOSTIC IMAGING:  I have independently reviewed the scans and discussed with the patient. No results found.   WRAP UP:  All questions were answered. The patient knows to call the clinic with any problems, questions or concerns.  Medical decision making: Moderate  Time spent on visit: I spent 20 minutes counseling the patient face to face. The total time spent in the appointment was 30 minutes and more than 50% was on counseling.  Carnella Guadalajara, PA-C  12/19/2022 2:04 PM

## 2022-12-19 ENCOUNTER — Inpatient Hospital Stay: Payer: Medicare HMO | Admitting: Hematology

## 2022-12-19 ENCOUNTER — Inpatient Hospital Stay: Payer: Medicare HMO

## 2022-12-19 ENCOUNTER — Inpatient Hospital Stay: Payer: Medicare HMO | Admitting: Physician Assistant

## 2022-12-19 VITALS — BP 118/80 | HR 77 | Temp 98.2°F | Resp 16 | Wt 208.6 lb

## 2022-12-19 DIAGNOSIS — R232 Flushing: Secondary | ICD-10-CM | POA: Diagnosis not present

## 2022-12-19 DIAGNOSIS — R61 Generalized hyperhidrosis: Secondary | ICD-10-CM | POA: Diagnosis not present

## 2022-12-19 DIAGNOSIS — E559 Vitamin D deficiency, unspecified: Secondary | ICD-10-CM

## 2022-12-19 DIAGNOSIS — D0511 Intraductal carcinoma in situ of right breast: Secondary | ICD-10-CM | POA: Diagnosis not present

## 2022-12-19 DIAGNOSIS — F17210 Nicotine dependence, cigarettes, uncomplicated: Secondary | ICD-10-CM | POA: Diagnosis not present

## 2022-12-19 DIAGNOSIS — Z79899 Other long term (current) drug therapy: Secondary | ICD-10-CM | POA: Diagnosis not present

## 2022-12-19 DIAGNOSIS — I890 Lymphedema, not elsewhere classified: Secondary | ICD-10-CM | POA: Diagnosis not present

## 2022-12-19 DIAGNOSIS — M79601 Pain in right arm: Secondary | ICD-10-CM | POA: Diagnosis not present

## 2022-12-19 DIAGNOSIS — Z79810 Long term (current) use of selective estrogen receptor modulators (SERMs): Secondary | ICD-10-CM | POA: Diagnosis not present

## 2022-12-19 DIAGNOSIS — N6081 Other benign mammary dysplasias of right breast: Secondary | ICD-10-CM | POA: Diagnosis not present

## 2022-12-19 DIAGNOSIS — Z170 Estrogen receptor positive status [ER+]: Secondary | ICD-10-CM | POA: Diagnosis not present

## 2022-12-19 DIAGNOSIS — Z9049 Acquired absence of other specified parts of digestive tract: Secondary | ICD-10-CM | POA: Diagnosis not present

## 2022-12-19 DIAGNOSIS — N6011 Diffuse cystic mastopathy of right breast: Secondary | ICD-10-CM | POA: Diagnosis not present

## 2022-12-19 LAB — COMPREHENSIVE METABOLIC PANEL
ALT: 23 U/L (ref 0–44)
AST: 25 U/L (ref 15–41)
Albumin: 3.7 g/dL (ref 3.5–5.0)
Alkaline Phosphatase: 85 U/L (ref 38–126)
Anion gap: 7 (ref 5–15)
BUN: 12 mg/dL (ref 6–20)
CO2: 23 mmol/L (ref 22–32)
Calcium: 9 mg/dL (ref 8.9–10.3)
Chloride: 104 mmol/L (ref 98–111)
Creatinine, Ser: 0.74 mg/dL (ref 0.44–1.00)
GFR, Estimated: >60 mL/min (ref >60)
Glucose, Bld: 129 mg/dL — ABNORMAL HIGH (ref 70–99)
Potassium: 3.9 mmol/L (ref 3.5–5.1)
Sodium: 134 mmol/L — ABNORMAL LOW (ref 135–145)
Total Bilirubin: 0.5 mg/dL (ref 0.3–1.2)
Total Protein: 7.2 g/dL (ref 6.5–8.1)

## 2022-12-19 LAB — CBC WITH DIFFERENTIAL/PLATELET
Abs Immature Granulocytes: 0.02 10*3/uL (ref 0.00–0.07)
Basophils Absolute: 0 10*3/uL (ref 0.0–0.1)
Basophils Relative: 1 %
Eosinophils Absolute: 0.1 10*3/uL (ref 0.0–0.5)
Eosinophils Relative: 2 %
HCT: 40.8 % (ref 36.0–46.0)
Hemoglobin: 13.6 g/dL (ref 12.0–15.0)
Immature Granulocytes: 0 %
Lymphocytes Relative: 28 %
Lymphs Abs: 1.6 10*3/uL (ref 0.7–4.0)
MCH: 28.7 pg (ref 26.0–34.0)
MCHC: 33.3 g/dL (ref 30.0–36.0)
MCV: 86.1 fL (ref 80.0–100.0)
Monocytes Absolute: 0.4 10*3/uL (ref 0.1–1.0)
Monocytes Relative: 7 %
Neutro Abs: 3.6 10*3/uL (ref 1.7–7.7)
Neutrophils Relative %: 62 %
Platelets: 195 10*3/uL (ref 150–400)
RBC: 4.74 MIL/uL (ref 3.87–5.11)
RDW: 13.1 % (ref 11.5–15.5)
WBC: 5.8 10*3/uL (ref 4.0–10.5)
nRBC: 0 % (ref 0.0–0.2)

## 2022-12-19 LAB — VITAMIN D 25 HYDROXY (VIT D DEFICIENCY, FRACTURES): Vit D, 25-Hydroxy: 37.04 ng/mL (ref 30–100)

## 2022-12-19 NOTE — Patient Instructions (Signed)
[facility] at [facility] **VISIT SUMMARY & IMPORTANT INSTRUCTIONS **   You were seen today by Rojelio Brenner PA-C for your history of breast cancer.    HISTORY OF RIGHT BREAST CANCER Your most recent labs, mammogram, and physical exam did not show any evidence of returning breast cancer. Continue to take tamoxifen daily as prescribed. The pain in your left armpit is likely due to scar tissue from your breast surgery.   Please review the attached exercises, which may help to improve your flexibility and decrease your pain.   You can also look into specialized massage therapists who can help with lymphedema and tight scar tissue after your surgery.  VITAMIN D DEFICIENCY Continue taking vitamin D 1000 units daily  FOLLOW-UP APPOINTMENT: Labs and office visit in 6 months  ** Thank you for trusting me with your healthcare!  I strive to provide all of my patients with quality care at each visit.  If you receive a survey for this visit, I would be so grateful to you for taking the time to provide feedback.  Thank you in advance!  ~ Audrea Bolte                   Dr. Doreatha Massed   &   Rojelio Brenner, PA-C   - - - - - - - - - - - - - - - - - -    Thank you for choosing [facility] at [facility] to provide your oncology and hematology care.  To afford each patient quality time with our provider, please arrive at least 15 minutes before your scheduled appointment time.   If you have a lab appointment with the Cancer Center please come in thru the Main Entrance and check in at the main information desk.  You need to re-schedule your appointment should you arrive 10 or more minutes late.  We strive to give you quality time with our providers, and arriving late affects you and other patients whose appointments are after yours.  Also, if you no show three or more times for appointments you may be dismissed from the clinic at the providers  discretion.     Again, thank you for choosing [facility].  Our hope is that these requests will decrease the amount of time that you wait before being seen by our physicians.       _____________________________________________________________  Should you have questions after your visit to [facility], please contact our office at [phone] and follow the prompts.  Our office hours are 8:00 a.m. and 4:30 p.m. Monday - Friday.  Please note that voicemails left after 4:00 p.m. may not be returned until the following business day.  We are closed weekends and major holidays.  You do have access to a nurse 24-7, just call the main number to the clinic [phone] and do not press any options, hold on the line and a nurse will answer the phone.    For prescription refill requests, have your pharmacy contact our office and allow 72 hours.

## 2023-06-09 DIAGNOSIS — D849 Immunodeficiency, unspecified: Secondary | ICD-10-CM | POA: Diagnosis not present

## 2023-06-09 DIAGNOSIS — F410 Panic disorder [episodic paroxysmal anxiety] without agoraphobia: Secondary | ICD-10-CM | POA: Diagnosis not present

## 2023-06-09 DIAGNOSIS — C50911 Malignant neoplasm of unspecified site of right female breast: Secondary | ICD-10-CM | POA: Diagnosis not present

## 2023-06-09 DIAGNOSIS — F339 Major depressive disorder, recurrent, unspecified: Secondary | ICD-10-CM | POA: Diagnosis not present

## 2023-06-09 DIAGNOSIS — Z299 Encounter for prophylactic measures, unspecified: Secondary | ICD-10-CM | POA: Diagnosis not present

## 2023-06-09 DIAGNOSIS — M25512 Pain in left shoulder: Secondary | ICD-10-CM | POA: Diagnosis not present

## 2023-06-19 ENCOUNTER — Inpatient Hospital Stay: Payer: Medicare HMO | Attending: Hematology

## 2023-06-19 DIAGNOSIS — F17210 Nicotine dependence, cigarettes, uncomplicated: Secondary | ICD-10-CM | POA: Diagnosis not present

## 2023-06-19 DIAGNOSIS — Z9049 Acquired absence of other specified parts of digestive tract: Secondary | ICD-10-CM | POA: Diagnosis not present

## 2023-06-19 DIAGNOSIS — Z79899 Other long term (current) drug therapy: Secondary | ICD-10-CM | POA: Insufficient documentation

## 2023-06-19 DIAGNOSIS — Z170 Estrogen receptor positive status [ER+]: Secondary | ICD-10-CM | POA: Diagnosis not present

## 2023-06-19 DIAGNOSIS — D0511 Intraductal carcinoma in situ of right breast: Secondary | ICD-10-CM | POA: Insufficient documentation

## 2023-06-19 DIAGNOSIS — Z1721 Progesterone receptor positive status: Secondary | ICD-10-CM | POA: Insufficient documentation

## 2023-06-19 DIAGNOSIS — Z79810 Long term (current) use of selective estrogen receptor modulators (SERMs): Secondary | ICD-10-CM | POA: Diagnosis not present

## 2023-06-19 DIAGNOSIS — E559 Vitamin D deficiency, unspecified: Secondary | ICD-10-CM

## 2023-06-19 DIAGNOSIS — N6011 Diffuse cystic mastopathy of right breast: Secondary | ICD-10-CM | POA: Diagnosis not present

## 2023-06-19 LAB — CBC WITH DIFFERENTIAL/PLATELET
Abs Immature Granulocytes: 0.02 10*3/uL (ref 0.00–0.07)
Basophils Absolute: 0.1 10*3/uL (ref 0.0–0.1)
Basophils Relative: 1 %
Eosinophils Absolute: 0.1 10*3/uL (ref 0.0–0.5)
Eosinophils Relative: 2 %
HCT: 43.3 % (ref 36.0–46.0)
Hemoglobin: 13.6 g/dL (ref 12.0–15.0)
Immature Granulocytes: 0 %
Lymphocytes Relative: 29 %
Lymphs Abs: 1.8 10*3/uL (ref 0.7–4.0)
MCH: 28.1 pg (ref 26.0–34.0)
MCHC: 31.4 g/dL (ref 30.0–36.0)
MCV: 89.5 fL (ref 80.0–100.0)
Monocytes Absolute: 0.5 10*3/uL (ref 0.1–1.0)
Monocytes Relative: 8 %
Neutro Abs: 3.9 10*3/uL (ref 1.7–7.7)
Neutrophils Relative %: 60 %
Platelets: 194 10*3/uL (ref 150–400)
RBC: 4.84 MIL/uL (ref 3.87–5.11)
RDW: 12.6 % (ref 11.5–15.5)
WBC: 6.4 10*3/uL (ref 4.0–10.5)
nRBC: 0 % (ref 0.0–0.2)

## 2023-06-19 LAB — COMPREHENSIVE METABOLIC PANEL
ALT: 30 U/L (ref 0–44)
AST: 27 U/L (ref 15–41)
Albumin: 3.9 g/dL (ref 3.5–5.0)
Alkaline Phosphatase: 100 U/L (ref 38–126)
Anion gap: 6 (ref 5–15)
BUN: 19 mg/dL (ref 6–20)
CO2: 25 mmol/L (ref 22–32)
Calcium: 9.3 mg/dL (ref 8.9–10.3)
Chloride: 104 mmol/L (ref 98–111)
Creatinine, Ser: 0.62 mg/dL (ref 0.44–1.00)
GFR, Estimated: >60 mL/min (ref >60)
Glucose, Bld: 130 mg/dL — ABNORMAL HIGH (ref 70–99)
Potassium: 4.2 mmol/L (ref 3.5–5.1)
Sodium: 135 mmol/L (ref 135–145)
Total Bilirubin: 0.4 mg/dL (ref 0.0–1.2)
Total Protein: 7.4 g/dL (ref 6.5–8.1)

## 2023-06-19 LAB — VITAMIN D 25 HYDROXY (VIT D DEFICIENCY, FRACTURES): Vit D, 25-Hydroxy: 32.73 ng/mL (ref 30–100)

## 2023-06-21 DIAGNOSIS — C50411 Malignant neoplasm of upper-outer quadrant of right female breast: Secondary | ICD-10-CM | POA: Diagnosis not present

## 2023-06-21 DIAGNOSIS — Z170 Estrogen receptor positive status [ER+]: Secondary | ICD-10-CM | POA: Diagnosis not present

## 2023-06-21 NOTE — Progress Notes (Signed)
Katrina Harvey, Katrina Harvey   CLINIC:  Medical Oncology/Hematology  PCP:  Katrina Peri, MD [address] [place] [address] [phone]   REASON FOR VISIT:  Follow-up for right breast DCIS.   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  Right lumpectomy performed on 12/24/2021.  Reexcision of close margin performed on 01/17/2022.  She received adjuvant radiation to the right breast from 05/05/2022 through 05/26/2022.  She was started on tamoxifen 20 mg daily in November 2023.  INTERVAL HISTORY:   Katrina Harvey, a 53 y.o. female, returns for routine follow-up of her history of right breast DCIS. Katrina Harvey was last seen on 12/19/22 by Katrina Brenner, PA-C.   At today's visit, she  reports feeling fairly well.  She denies any recent hospitalizations, surgeries, or changes in her  baseline health status.  She reports that the "pulling pain" in her right armpit has improved somewhat with exercises and massage.  She does still report some breast soreness around her right lumpectomy scar. She does not have any swelling in her right arm or right breast.   She denies any symptoms of recurrence such as new lumps, bone pain, chest pain, dyspnea, or abdominal pain.   She has no new headaches, seizures, or focal neurologic deficits.  No B symptoms such as fever, chills, night sweats, unintentional weight loss.  She continues to take tamoxifen and is tolerating this fairly well.  She reports mild hot flashes and night sweats, but does not feel that she needs any medication to ameliorate these at this time.  She has not had any vaginal bleeding since starting tamoxifen.  She denies any  history of DVT/PE, or current symptoms of DVT/PE.  She reports 60% energy and 100% appetite.  She is maintaining stable weight at this time.  ASSESSMENT & PLAN:  1.  Right breast DCIS, ER positive: - Mammogram (09/16/2021): BI-RADS Category 0 with possible asymmetry with calcifications in the right breast - Diagnostic mammogram and ultrasound (10/20/2021): 1.7 cm mass with associated calcifications in the right breast at 11 o'clock position - Biopsy right breast 11:00 (10/20/2021): DCIS, moderate to high nuclear grade, solid to cribriform pattern, with comedonecrosis and calcification.  Tumor involves/4 biopsy specimens.  Invasive carcinoma not identified.  ER: 70% positive, PR 2% positive - Right lumpectomy on 12/24/2021, pathology: High-grade DCIS with comedonecrosis, focal microinvasion seen.  Tumor size is 1 mm.  pT1 MI, pN0 closest medial margin 1 mm.  0/3 lymph nodes involved. - Reexcision of close margin (01/17/2022): Benign breast tissue with fibrocystic changes including stromal fibrosis, cystic dilatation of ducts, apocrine metaplasia, adenosis and focal epithelial hyperplasia without atypia.  Negative for carcinoma. - Started Tamoxifen 20 mg daily in November 2023, to be continued through November 2028 for 5 years of treatment.  She is tolerating tamoxifen fairly well, apart from some mild hot flashes.   - Received adjuvant radiation to the right breast from 05/05/2022-05/26/2022 - Most recent mammogram (11/16/2022 via [facility]): BI-RADS Category 2, benign.  Postlumpectomy changes in upper/outer aspect of right breast.  Right breast skin thickening and mild trabecular thickening consistent with radiation-induced edema.  No new or suspicious breast masses, no areas of nonsurgical architectural distortion,  and no suspicious calcifications. - Most recent labs (06/19/2023): Normal CBC.  Normal LFTs, with CMP unremarkable, no prohibitive toxicities to continuing tamoxifen. - Physical exam (06/26/23) reveals  areas of dense breast tissue bilaterally.   LEFT BREAST: Within normal limits. RIGHT BREAST: Mild tenderness at right lumpectomy scar in upper/outer quadrant.  No palpable masses or lymphadenopathy.  Mild ecchymosis on the medial aspect of areola (present since lumpectomy per patient report).  Small freely mobile cyst with rounded edges in right breast at 3 o'clock position, about 1 cm from nipple. - No "red flag" symptoms of recurrence per history/ROS   - PLAN: Continue tamoxifen. - RTC 6 months with labs. - Next diagnostic mammogram due June 2025 (to be obtained at [facility] per patient preference)  2.  Bone health: - Bone density scan via PCP (08/06/2021): T-score -1.0, normal. - Vitamin D level on 08/11/2022 improved to 37.06. - Vitamin D (06/19/2023): Normal at 32.73 - She is on vitamin D 1000 units daily. - PLAN: Continue vitamin D 1000 units daily.    3.  Tobacco abuse - This patient meets criteria for low-dose CT lung cancer screening - currently age 79, smoking 35 years at least one pack per day (35 pack-year history), current every day smoker (1 PPD) - Shared decision making visit (06/26/2023) included risks and benefits of screening, potential for follow-up, diagnostic testing for abnormal scans, potential for false positive tests, overdiagnosis, discussion about total radiation exposure - PLAN: Patient agreeable to proceed with lung cancer screening program.  We will schedule her for LDCT chest. - Patient has been referred to Lung Cancer Screening Nurse Coordinator for further scheduling of LDCT and for further resources regarding free nicotine replacement therapy and information about smoking cessation classes   4.  Social/family history: - She is currently on disability.  She used to work as a Associate Professor.  Current active smoker, 1 pack/day for 30 years.  No chemical exposure. - Maternal uncle had cancer, type unknown to the patient.  Maternal cousin has lung cancer and another maternal  cousin has breast cancer. - LMP on 11/03/2021.  Menses have gotten irregular once every 2 to 3 months since 2022.  She denies any menstrual cycle since starting tamoxifen.   PLAN SUMMARY: >> Schedule for first LDCT Chest for [facility] >> Diagnostic mammogram due June 2025 - patient plans to do this via [facility] >> Labs in 6 months = CBC/D, CMP, vitamin D >> OFFICE visit in 6 months    REVIEW OF SYSTEMS:   Review of Systems  Constitutional:  Positive for fatigue. Negative for appetite change, chills, diaphoresis, fever and unexpected weight change.  HENT:   Negative for lump/mass and nosebleeds.   Eyes:  Negative for eye problems.  Respiratory:  Negative for cough, hemoptysis and shortness of breath.   Cardiovascular:  Negative for chest pain, leg swelling and palpitations.  Gastrointestinal:  Negative for abdominal pain, blood in stool, constipation, diarrhea, nausea and vomiting.  Genitourinary:  Negative for hematuria.   Musculoskeletal:  Negative for myalgias.  Skin: Negative.   Neurological:  Negative for dizziness, headaches and light-headedness.  Hematological:  Does not bruise/bleed easily.    PHYSICAL EXAM:   Performance status (ECOG): 1 - Symptomatic but completely ambulatory  Vitals:   06/26/23 1326  BP: 116/80  Pulse: 73  Resp: 18  Temp: 97.6 F (36.4 C)  SpO2: 96%   Wt Readings from Last 3 Encounters:  06/26/23 209 lb (94.8 kg)  12/19/22 208 lb 8.9  oz (94.6 kg)  08/18/22 216 lb 8 oz (98.2 kg)   Physical Exam Constitutional:      Appearance: Normal appearance. She is obese.  Cardiovascular:     Heart sounds: Normal heart sounds.  Pulmonary:     Breath sounds: Normal breath sounds.  Chest:  Breasts:    Right: Tenderness present.       Comments: Areas of dense breast tissue bilaterally.   >> LEFT BREAST: Within normal limits. >> RIGHT BREAST: Mild tenderness at right lumpectomy scar in upper/outer quadrant.  No palpable masses or lymphadenopathy.  Mild ecchymosis  on the medial aspect of areola (present since lumpectomy per patient report).  Small freely mobile cyst with rounded edges in right breast at 3 o'clock position, about 1 cm from nipple. Lymphadenopathy:     Upper Body:     Right upper body: No supraclavicular, axillary or pectoral adenopathy.     Left upper body: No supraclavicular, axillary or pectoral adenopathy.  Neurological:     General: No focal deficit present.     Mental Status: Mental status is at baseline.  Psychiatric:        Behavior: Behavior normal. Behavior is cooperative.     PAST MEDICAL/SURGICAL HISTORY:  Past Medical History:  Diagnosis Date   AVM (arteriovenous malformation)    Breast cancer (HCC)    Cancer (HCC)    breast cancer   Microscopic colitis    Past Surgical History:  Procedure Laterality Date   BREAST LUMPECTOMY Right 01/17/2022   partial mastectomy   CHOLECYSTECTOMY     CRANIOTOMY FOR AVM  2013   MASTECTOMY, PARTIAL Right 01/17/2022   Procedure: MASTECTOMY PARTIAL;  Surgeon: Franky Macho, MD;  Location: [facility];  Service: General;  Laterality: Right;   PARTIAL MASTECTOMY WITH AXILLARY SENTINEL LYMPH NODE BIOPSY Right 12/24/2021   Procedure: PARTIAL MASTECTOMY WITH AXILLARY SENTINEL LYMPH NODE WITH RADIO FREQUENCY TAG PLACEMENT;  Surgeon: Franky Macho, MD;  Location: [facility];  Service: General;  Laterality: Right;    SOCIAL HISTORY:  Social History   Socioeconomic History   Marital status: Married    Spouse name: Not on file   Number of children: Not on file   Years of education: Not on file   Highest education level: Not on file  Occupational History   Not on file  Tobacco Use   Smoking status: Every Day    Types: Cigarettes   Smokeless tobacco: Never  Substance and Sexual Activity   Alcohol use: Not Currently   Drug use: Never   Sexual activity: Not on file  Other Topics Concern   Not on file  Social History Narrative   Not on file   Social Drivers of Health   Financial  Resource Strain: Not on file  Food Insecurity: Not on file  Transportation Needs: Not on file  Physical Activity: Not on file  Stress: Not on file  Social Connections: Not on file  Intimate Partner Violence: Not on file    FAMILY HISTORY:  Family History  Problem Relation Age of Onset   Breast cancer Neg Hx     CURRENT MEDICATIONS:  Current Outpatient Medications  Medication Sig Dispense Refill   ALPRAZolam (XANAX) 0.5 MG tablet Take 0.5 mg by mouth 2 (two) times daily as needed.     Cholecalciferol (VITAMIN D) 50 MCG (2000 UT) tablet Take 2,000 Units by mouth daily.     escitalopram (LEXAPRO) 10 MG tablet Take 10 mg by mouth daily.  loperamide (IMODIUM) 2 MG capsule Take 2 mg by mouth as needed for diarrhea or loose stools.     pantoprazole (PROTONIX) 40 MG tablet Take 40 mg by mouth daily.     tamoxifen (NOLVADEX) 20 MG tablet Take 1 tablet (20 mg total) by mouth daily. 90 tablet 4   No current facility-administered medications for this visit.    ALLERGIES:  No Known Allergies  LABORATORY DATA:  I have reviewed the labs as listed.     Latest Ref Rng & Units 06/19/2023   11:04 AM 12/19/2022   12:42 PM 08/11/2022    2:08 PM  CBC  WBC 4.0 - 10.5 K/uL 6.4  5.8  5.6   Hemoglobin 12.0 - 15.0 g/dL 16.1  09.6  04.5   Hematocrit 36.0 - 46.0 % 43.3  40.8  38.7   Platelets 150 - 400 K/uL 194  195  175       Latest Ref Rng & Units 06/19/2023   11:04 AM 12/19/2022   12:42 PM 08/11/2022    2:08 PM  CMP  Glucose 70 - 99 mg/dL 409  811  85   BUN 6 - 20 mg/dL 19  12  13    Creatinine 0.44 - 1.00 mg/dL 9.14  7.82  9.56   Sodium 135 - 145 mmol/L 135  134  136   Potassium 3.5 - 5.1 mmol/L 4.2  3.9  3.8   Chloride 98 - 111 mmol/L 104  104  103   CO2 22 - 32 mmol/L 25  23  25    Calcium 8.9 - 10.3 mg/dL 9.3  9.0  8.9   Total Protein 6.5 - 8.1 g/dL 7.4  7.2  6.8   Total Bilirubin 0.0 - 1.2 mg/dL 0.4  0.5  0.4   Alkaline Phos 38 - 126 U/L 100  85  80   AST 15 - 41 U/L 27  25  21     ALT 0 - 44 U/L 30  23  19      DIAGNOSTIC IMAGING:  I have independently reviewed the scans and discussed with the patient. No results found.   WRAP UP:  All questions were answered. The patient knows to call the clinic with any problems, questions or concerns.  Medical decision making: Moderate  Time spent on visit: I spent 20 minutes counseling the patient face to face. The total time spent in the appointment was 30 minutes and more than 50% was on counseling.  Carnella Guadalajara, PA-C  06/26/23 3:38 PM

## 2023-06-26 ENCOUNTER — Inpatient Hospital Stay: Payer: Medicare HMO | Admitting: Physician Assistant

## 2023-06-26 VITALS — BP 116/80 | HR 73 | Temp 97.6°F | Resp 18 | Ht 63.0 in | Wt 209.0 lb

## 2023-06-26 DIAGNOSIS — N6011 Diffuse cystic mastopathy of right breast: Secondary | ICD-10-CM | POA: Diagnosis not present

## 2023-06-26 DIAGNOSIS — Z1721 Progesterone receptor positive status: Secondary | ICD-10-CM | POA: Diagnosis not present

## 2023-06-26 DIAGNOSIS — D0511 Intraductal carcinoma in situ of right breast: Secondary | ICD-10-CM

## 2023-06-26 DIAGNOSIS — E559 Vitamin D deficiency, unspecified: Secondary | ICD-10-CM | POA: Diagnosis not present

## 2023-06-26 DIAGNOSIS — Z87891 Personal history of nicotine dependence: Secondary | ICD-10-CM

## 2023-06-26 DIAGNOSIS — F17210 Nicotine dependence, cigarettes, uncomplicated: Secondary | ICD-10-CM | POA: Diagnosis not present

## 2023-06-26 DIAGNOSIS — Z170 Estrogen receptor positive status [ER+]: Secondary | ICD-10-CM | POA: Diagnosis not present

## 2023-06-26 DIAGNOSIS — Z79810 Long term (current) use of selective estrogen receptor modulators (SERMs): Secondary | ICD-10-CM | POA: Diagnosis not present

## 2023-06-26 DIAGNOSIS — Z79899 Other long term (current) drug therapy: Secondary | ICD-10-CM | POA: Diagnosis not present

## 2023-06-26 DIAGNOSIS — Z9049 Acquired absence of other specified parts of digestive tract: Secondary | ICD-10-CM | POA: Diagnosis not present

## 2023-06-26 NOTE — Patient Instructions (Signed)
[facility] at [facility] **VISIT SUMMARY & IMPORTANT INSTRUCTIONS **   You were seen today by Rojelio Brenner PA-C for your history of breast cancer.    HISTORY OF RIGHT BREAST CANCER Your most recent labs, mammogram, and physical exam did not show any evidence of returning breast cancer. Continue to take tamoxifen daily as prescribed. Your next mammogram is due in June 2025.  You can do this at [facility], as you did previously.  However, if for any reason you are unable to have this done at [facility], please contact our office so that we can schedule it to be done here at [facility].  VITAMIN D DEFICIENCY Continue taking vitamin D 1000 units daily  TOBACCO USE:  You qualify for low-dose CT scan of your lungs once a year due to your increased risk for lung cancer (due to tobacco use). Please see the attached handout for some "tips and tricks" to help you quit smoking. I also recommend that you go online and search for [facility] Quit Smart for information on FREE smoking cessation classes!  FOLLOW-UP APPOINTMENT: Labs and office visit in 6 months  ** Thank you for trusting me with your healthcare!  I strive to provide all of my patients with quality care at each visit.  If you receive a survey for this visit, I would be so grateful to you for taking the time to provide feedback.  Thank you in advance!  ~ Yonah Tangeman                   Dr. Doreatha Massed   &   Rojelio Brenner, PA-C   - - - - - - - - - - - - - - - - - -    Thank you for choosing [facility] at [facility] to provide your oncology and hematology care.  To afford each patient quality time with our provider, please arrive at least 15 minutes before your scheduled appointment time.   If you have a lab appointment with the Cancer Center please come in thru the Main Entrance and check in at the main information desk.  You need to re-schedule your appointment should you arrive 10 or more  minutes late.  We strive to give you quality time with our providers, and arriving late affects you and other patients whose appointments are after yours.  Also, if you no show three or more times for appointments you may be dismissed from the clinic at the providers discretion.     Again, thank you for choosing [facility].  Our hope is that these requests will decrease the amount of time that you wait before being seen by our physicians.       _____________________________________________________________  Should you have questions after your visit to [facility], please contact our office at [phone] and follow the prompts.  Our office hours are 8:00 a.m. and 4:30 p.m. Monday - Friday.  Please note that voicemails left after 4:00 p.m. may not be returned until the following business day.  We are closed weekends and major holidays.  You do have access to a nurse 24-7, just call the main number to the clinic [phone] and do not press any options, hold on the line and a nurse will answer the phone.    For prescription refill requests, have your pharmacy contact our office and allow 72 hours.

## 2023-07-12 ENCOUNTER — Telehealth: Payer: Self-pay | Admitting: *Deleted

## 2023-07-12 NOTE — Telephone Encounter (Addendum)
 Patient called to advise that she has had new onset breakthrough bleeding x 1 week.  Per [facility], [facility] She will need referral to GYN, sooner rather than later. Breakthrough bleeding is likely nothing to worry about, but being on tamoxifen  does place her at increased risk of uterine abnormalities (including uterine cancer), so it is important that this is evaluated by gynecologist at earliest availability.  Patient made aware and referral will be sent to [facility].  Verbalized understanding of information provided.

## 2023-07-14 ENCOUNTER — Ambulatory Visit: Payer: Medicare HMO | Admitting: Obstetrics & Gynecology

## 2023-07-14 ENCOUNTER — Encounter: Payer: Self-pay | Admitting: Obstetrics & Gynecology

## 2023-07-14 ENCOUNTER — Other Ambulatory Visit (HOSPITAL_COMMUNITY)
Admission: RE | Admit: 2023-07-14 | Discharge: 2023-07-14 | Disposition: A | Discharge status: Home / Self Care | Payer: Medicare HMO | Source: Ambulatory Visit | Attending: Obstetrics & Gynecology | Admitting: Obstetrics & Gynecology

## 2023-07-14 VITALS — BP 116/77 | HR 85 | Ht 63.0 in | Wt 210.0 lb

## 2023-07-14 DIAGNOSIS — Z79810 Long term (current) use of selective estrogen receptor modulators (SERMs): Secondary | ICD-10-CM | POA: Diagnosis not present

## 2023-07-14 DIAGNOSIS — N950 Postmenopausal bleeding: Secondary | ICD-10-CM | POA: Diagnosis not present

## 2023-07-14 DIAGNOSIS — D0511 Intraductal carcinoma in situ of right breast: Secondary | ICD-10-CM | POA: Diagnosis not present

## 2023-07-14 DIAGNOSIS — N393 Stress incontinence (female) (male): Secondary | ICD-10-CM

## 2023-07-14 DIAGNOSIS — N858 Other specified noninflammatory disorders of uterus: Secondary | ICD-10-CM | POA: Diagnosis not present

## 2023-07-14 NOTE — Progress Notes (Signed)
 GYN VISIT Patient name: Katrina Harvey MRN [id number]  Date of birth: 1970/09/05 Chief Complaint:   Vaginal Bleeding (On tamoxifen )  History of Present Illness:   Katrina Harvey is a 53 y.o.PM female with h.o breast Ca currently on Tamoxifen  who presents for:    Vaginal spotting: Started about 1-2 weeks ago.  When she wipes notes the spotting on toilet paper- red in color.  Last seen a few days ago.  She has also noted some mild cramping.  She reports that her last period was more than 15 months ago  She also notes urinary leakage mostly when she coughs and sneezes.  She has to wear a pad every day  No LMP recorded. (Menstrual status: Chemotherapy).    Review of Systems:   Pertinent items are noted in HPI Denies fever/chills, dizziness, headaches, visual disturbances, fatigue, shortness of breath, chest pain, abdominal pain, vomiting Pertinent History Reviewed:   Past Surgical History:  Procedure Laterality Date   BREAST LUMPECTOMY Right 01/17/2022   partial mastectomy   CHOLECYSTECTOMY     CRANIOTOMY FOR AVM  2013   MASTECTOMY, PARTIAL Right 01/17/2022   Procedure: MASTECTOMY PARTIAL;  Surgeon: Mavis Anes, MD;  Location: [facility];  Service: General;  Laterality: Right;   PARTIAL MASTECTOMY WITH AXILLARY SENTINEL LYMPH NODE BIOPSY Right 12/24/2021   Procedure: PARTIAL MASTECTOMY WITH AXILLARY SENTINEL LYMPH NODE WITH RADIO FREQUENCY TAG PLACEMENT;  Surgeon: Mavis Anes, MD;  Location: [facility];  Service: General;  Laterality: Right;    Past Medical History:  Diagnosis Date   AVM (arteriovenous malformation)    Breast cancer (HCC)    Cancer (HCC)    breast cancer   Microscopic colitis    Reviewed problem list, medications and allergies. Physical Assessment:   Vitals:   07/14/23 1113  BP: 116/77  Pulse: 85  Weight: 210 lb (95.3 kg)  Height: 5' 3 (1.6 m)  Body mass index is 37.2 kg/m.       Physical Examination:   General appearance: alert, well appearing, and in no  distress  Psych: mood appropriate, normal affect  Skin: warm & dry   Cardiovascular: normal heart rate noted  Respiratory: normal respiratory effort, no distress  Abdomen: soft, non-tender, no rebound, no guarding  Pelvic: VULVA: normal appearing vulva with no masses, tenderness or lesions, VAGINA: normal appearing vagina with normal color and discharge, no lesions, CERVIX: normal appearing cervix without discharge or lesions, UTERUS: uterus is normal size, shape, consistency and nontender, no adnexal masses or tenderness appreciated  Extremities: no edema   Chaperone: Alan Fischer    Endometrial Biopsy Procedure Note  Pre-operative Diagnosis: Postmenopausal bleeding  Post-operative Diagnosis: same  Procedure Details   The risks (including infection, bleeding, pain, and uterine perforation) and benefits of the procedure were explained to the patient and Written informed consent was obtained.  Antibiotic prophylaxis against endocarditis was not indicated.   The patient was placed in the dorsal lithotomy position.  Bimanual exam showed the uterus to be in the neutral position.  A speculum inserted in the vagina, and the cervix prepped with betadine.     A single tooth tenaculum was applied to the anterior lip of the cervix for stabilization.  Os finder was used.  A Pipelle endometrial aspirator was used to sample the endometrium.  Sample was sent for pathologic examination.  Condition: Stable  Complications: None   Assessment & Plan:  1) postmenopausal bleeding -Reviewed potential etiology ranging from benign findings to endometrial cancer -Discussed workup  including pelvic ultrasound however in light of tamoxifen  use will suspect that the endometrium will likely to be thickened -Discussed next step which would include endometrial biopsy, patient agreeable to that today.  See above informed consent and procedure completed Next step pending results of pathology.   The patient was  advised to call for any fever or for prolonged or severe pain or bleeding. She was advised to use OTC analgesics as needed for mild to moderate pain. She was advised to avoid vaginal intercourse for 48 hours or until the bleeding has completely stopped.  2) Stress incontinence -Reviewed conservative management including weight loss, Kegel exercises and bladder training  No orders of the defined types were placed in this encounter.   Return in about 1 year (around 07/13/2024) for Annual.   Debbe Crumble, DO Attending Obstetrician & Gynecologist, [facility] for [facility], [facility]  '

## 2023-07-18 LAB — SURGICAL PATHOLOGY

## 2023-07-19 ENCOUNTER — Encounter: Payer: Self-pay | Admitting: Obstetrics & Gynecology

## 2023-08-10 ENCOUNTER — Other Ambulatory Visit: Payer: Self-pay | Admitting: Hematology

## 2023-08-17 ENCOUNTER — Ambulatory Visit (HOSPITAL_COMMUNITY): Payer: Medicare HMO

## 2023-08-24 DIAGNOSIS — R5383 Other fatigue: Secondary | ICD-10-CM | POA: Diagnosis not present

## 2023-08-24 DIAGNOSIS — Z7189 Other specified counseling: Secondary | ICD-10-CM | POA: Diagnosis not present

## 2023-08-24 DIAGNOSIS — C50911 Malignant neoplasm of unspecified site of right female breast: Secondary | ICD-10-CM | POA: Diagnosis not present

## 2023-08-24 DIAGNOSIS — Z299 Encounter for prophylactic measures, unspecified: Secondary | ICD-10-CM | POA: Diagnosis not present

## 2023-08-24 DIAGNOSIS — Z1331 Encounter for screening for depression: Secondary | ICD-10-CM | POA: Diagnosis not present

## 2023-08-24 DIAGNOSIS — Z79899 Other long term (current) drug therapy: Secondary | ICD-10-CM | POA: Diagnosis not present

## 2023-08-24 DIAGNOSIS — F17210 Nicotine dependence, cigarettes, uncomplicated: Secondary | ICD-10-CM | POA: Diagnosis not present

## 2023-08-24 DIAGNOSIS — Z0000 Encounter for general adult medical examination without abnormal findings: Secondary | ICD-10-CM | POA: Diagnosis not present

## 2023-09-07 DIAGNOSIS — H2513 Age-related nuclear cataract, bilateral: Secondary | ICD-10-CM | POA: Diagnosis not present

## 2023-09-07 DIAGNOSIS — D3132 Benign neoplasm of left choroid: Secondary | ICD-10-CM | POA: Diagnosis not present

## 2023-09-07 DIAGNOSIS — H53462 Homonymous bilateral field defects, left side: Secondary | ICD-10-CM | POA: Diagnosis not present

## 2023-10-23 ENCOUNTER — Ambulatory Visit (HOSPITAL_COMMUNITY)
Admission: RE | Admit: 2023-10-23 | Discharge: 2023-10-23 | Disposition: A | Discharge status: Home / Self Care | Source: Ambulatory Visit | Attending: Physician Assistant | Admitting: Physician Assistant

## 2023-10-23 DIAGNOSIS — Z87891 Personal history of nicotine dependence: Secondary | ICD-10-CM | POA: Diagnosis not present

## 2023-10-23 DIAGNOSIS — F17210 Nicotine dependence, cigarettes, uncomplicated: Secondary | ICD-10-CM | POA: Diagnosis not present

## 2023-11-16 DIAGNOSIS — Z299 Encounter for prophylactic measures, unspecified: Secondary | ICD-10-CM | POA: Diagnosis not present

## 2023-11-16 DIAGNOSIS — C50911 Malignant neoplasm of unspecified site of right female breast: Secondary | ICD-10-CM | POA: Diagnosis not present

## 2023-11-16 DIAGNOSIS — I700 Atherosclerosis of aorta: Secondary | ICD-10-CM | POA: Diagnosis not present

## 2023-11-16 DIAGNOSIS — Z0000 Encounter for general adult medical examination without abnormal findings: Secondary | ICD-10-CM | POA: Diagnosis not present

## 2023-11-16 DIAGNOSIS — F410 Panic disorder [episodic paroxysmal anxiety] without agoraphobia: Secondary | ICD-10-CM | POA: Diagnosis not present

## 2023-11-28 DIAGNOSIS — I10 Essential (primary) hypertension: Secondary | ICD-10-CM | POA: Diagnosis not present

## 2023-11-28 DIAGNOSIS — F32A Depression, unspecified: Secondary | ICD-10-CM | POA: Diagnosis not present

## 2023-11-28 DIAGNOSIS — I2510 Atherosclerotic heart disease of native coronary artery without angina pectoris: Secondary | ICD-10-CM | POA: Diagnosis not present

## 2023-11-28 DIAGNOSIS — Z299 Encounter for prophylactic measures, unspecified: Secondary | ICD-10-CM | POA: Diagnosis not present

## 2023-12-04 ENCOUNTER — Encounter: Payer: Self-pay | Admitting: *Deleted

## 2023-12-04 NOTE — Progress Notes (Signed)
 Patient notified via mail of LDCT lung cancer screening results with recommendations to follow up in 12 months.  Also notified of incidental findings and need to follow up with PCP.  Patient's referring provider was sent a copy of results.    IMPRESSION: 1. Lung-RADS 1, negative. Continue annual screening with low-dose chest CT without contrast in 12 months. 2. Three-vessel coronary atherosclerosis. 3. Prominent diffuse hepatic steatosis. 4. Aortic Atherosclerosis (ICD10-I70.0) and Emphysema (ICD10-J43.9).

## 2023-12-16 ENCOUNTER — Other Ambulatory Visit: Payer: Self-pay | Admitting: Physician Assistant

## 2023-12-18 ENCOUNTER — Inpatient Hospital Stay: Payer: Medicare HMO | Attending: Hematology

## 2023-12-18 DIAGNOSIS — K760 Fatty (change of) liver, not elsewhere classified: Secondary | ICD-10-CM | POA: Insufficient documentation

## 2023-12-18 DIAGNOSIS — J439 Emphysema, unspecified: Secondary | ICD-10-CM | POA: Insufficient documentation

## 2023-12-18 DIAGNOSIS — Z79899 Other long term (current) drug therapy: Secondary | ICD-10-CM | POA: Insufficient documentation

## 2023-12-18 DIAGNOSIS — D0511 Intraductal carcinoma in situ of right breast: Secondary | ICD-10-CM | POA: Diagnosis not present

## 2023-12-18 DIAGNOSIS — I2510 Atherosclerotic heart disease of native coronary artery without angina pectoris: Secondary | ICD-10-CM | POA: Insufficient documentation

## 2023-12-18 DIAGNOSIS — Z79810 Long term (current) use of selective estrogen receptor modulators (SERMs): Secondary | ICD-10-CM | POA: Diagnosis not present

## 2023-12-18 DIAGNOSIS — E559 Vitamin D deficiency, unspecified: Secondary | ICD-10-CM

## 2023-12-18 DIAGNOSIS — F17200 Nicotine dependence, unspecified, uncomplicated: Secondary | ICD-10-CM | POA: Insufficient documentation

## 2023-12-18 DIAGNOSIS — Z170 Estrogen receptor positive status [ER+]: Secondary | ICD-10-CM | POA: Diagnosis not present

## 2023-12-18 DIAGNOSIS — I700 Atherosclerosis of aorta: Secondary | ICD-10-CM | POA: Insufficient documentation

## 2023-12-18 LAB — CBC WITH DIFFERENTIAL/PLATELET
Abs Immature Granulocytes: 0.02 K/uL (ref 0.00–0.07)
Basophils Absolute: 0.1 K/uL (ref 0.0–0.1)
Basophils Relative: 1 %
Eosinophils Absolute: 0.1 K/uL (ref 0.0–0.5)
Eosinophils Relative: 2 %
HCT: 38.7 % (ref 36.0–46.0)
Hemoglobin: 12.5 g/dL (ref 12.0–15.0)
Immature Granulocytes: 0 %
Lymphocytes Relative: 33 %
Lymphs Abs: 2.1 K/uL (ref 0.7–4.0)
MCH: 28.4 pg (ref 26.0–34.0)
MCHC: 32.3 g/dL (ref 30.0–36.0)
MCV: 88 fL (ref 80.0–100.0)
Monocytes Absolute: 0.5 K/uL (ref 0.1–1.0)
Monocytes Relative: 8 %
Neutro Abs: 3.7 K/uL (ref 1.7–7.7)
Neutrophils Relative %: 56 %
Platelets: 195 K/uL (ref 150–400)
RBC: 4.4 MIL/uL (ref 3.87–5.11)
RDW: 12.8 % (ref 11.5–15.5)
WBC: 6.5 K/uL (ref 4.0–10.5)
nRBC: 0 % (ref 0.0–0.2)

## 2023-12-18 LAB — COMPREHENSIVE METABOLIC PANEL WITH GFR
ALT: 21 U/L (ref 0–44)
AST: 26 U/L (ref 15–41)
Albumin: 3.4 g/dL — ABNORMAL LOW (ref 3.5–5.0)
Alkaline Phosphatase: 92 U/L (ref 38–126)
Anion gap: 9 (ref 5–15)
BUN: 14 mg/dL (ref 6–20)
CO2: 23 mmol/L (ref 22–32)
Calcium: 8.7 mg/dL — ABNORMAL LOW (ref 8.9–10.3)
Chloride: 104 mmol/L (ref 98–111)
Creatinine, Ser: 0.86 mg/dL (ref 0.44–1.00)
GFR, Estimated: >60 mL/min (ref >60)
Glucose, Bld: 134 mg/dL — ABNORMAL HIGH (ref 70–99)
Potassium: 3.3 mmol/L — ABNORMAL LOW (ref 3.5–5.1)
Sodium: 136 mmol/L (ref 135–145)
Total Bilirubin: 0.6 mg/dL (ref 0.0–1.2)
Total Protein: 6.8 g/dL (ref 6.5–8.1)

## 2023-12-18 LAB — VITAMIN D 25 HYDROXY (VIT D DEFICIENCY, FRACTURES): Vit D, 25-Hydroxy: 31.57 ng/mL (ref 30–100)

## 2023-12-25 ENCOUNTER — Inpatient Hospital Stay (HOSPITAL_BASED_OUTPATIENT_CLINIC_OR_DEPARTMENT_OTHER): Payer: Medicare HMO | Admitting: Oncology

## 2023-12-25 VITALS — BP 102/70 | HR 77 | Temp 98.0°F | Resp 16 | Wt 214.1 lb

## 2023-12-25 DIAGNOSIS — Z720 Tobacco use: Secondary | ICD-10-CM | POA: Insufficient documentation

## 2023-12-25 DIAGNOSIS — Z122 Encounter for screening for malignant neoplasm of respiratory organs: Secondary | ICD-10-CM | POA: Insufficient documentation

## 2023-12-25 DIAGNOSIS — Z79899 Other long term (current) drug therapy: Secondary | ICD-10-CM | POA: Diagnosis not present

## 2023-12-25 DIAGNOSIS — D0511 Intraductal carcinoma in situ of right breast: Secondary | ICD-10-CM

## 2023-12-25 DIAGNOSIS — I2510 Atherosclerotic heart disease of native coronary artery without angina pectoris: Secondary | ICD-10-CM | POA: Diagnosis not present

## 2023-12-25 DIAGNOSIS — Z170 Estrogen receptor positive status [ER+]: Secondary | ICD-10-CM | POA: Diagnosis not present

## 2023-12-25 DIAGNOSIS — K760 Fatty (change of) liver, not elsewhere classified: Secondary | ICD-10-CM | POA: Diagnosis not present

## 2023-12-25 DIAGNOSIS — F17200 Nicotine dependence, unspecified, uncomplicated: Secondary | ICD-10-CM | POA: Diagnosis not present

## 2023-12-25 DIAGNOSIS — I700 Atherosclerosis of aorta: Secondary | ICD-10-CM | POA: Diagnosis not present

## 2023-12-25 DIAGNOSIS — Z79810 Long term (current) use of selective estrogen receptor modulators (SERMs): Secondary | ICD-10-CM | POA: Diagnosis not present

## 2023-12-25 DIAGNOSIS — J439 Emphysema, unspecified: Secondary | ICD-10-CM | POA: Diagnosis not present

## 2023-12-25 NOTE — Assessment & Plan Note (Signed)
 CT chest WO contrast 05/25: Negative  - Repeat in 12 months

## 2023-12-25 NOTE — Progress Notes (Signed)
 Patient Care Team: Maree Isles, MD as PCP - General (Internal Medicine) Celestia Joesph SQUIBB, RN as Oncology Nurse Navigator (Medical Oncology) Rogers Hai, MD as Medical Oncologist (Medical Oncology)  Clinic Day:  12/25/2023  Referring physician: Maree Isles, MD   CHIEF COMPLAINT:  CC: Right breast DCIS   ASSESSMENT & PLAN:   Assessment & Plan: Katrina Harvey  is a 53 y.o. 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  S/p lumpectomy and SLNB on 12/24/2021. Reexcision of close margins on 01/17/2022. Adjuvant radiation to the right breast from 05/05/2022-05/26/2022.  Started on tamoxifen  20 mg daily in November 2023  - Patient tolerating tamoxifen  well.  Continue 20 mg daily - Had breakthrough bleeding in February 2025 and had endometrial biopsy done that was negative for hyperplasia or malignancy - Mammogram due 11/2023.  Advised patient to schedule this and patient wishes to do it at [facility] -Continue vitamin D  and calcium for bone health  -Labs reviewed today.  CBC and CMP WNL.  Will repeat them in 6 months.  Return to clinic in 3 months for physical exam Tobacco use Patient is a current smoker. No wishing to quit smoking  - Emphasized on risk of smoking including cancer and heart disease Screening for lung cancer CT chest WO contrast 05/25: Negative  - Repeat in 12 months   The patient understands the plans discussed today and is in agreement with them.  She knows to contact our office if she develops concerns prior to her next appointment.  I provided 20 minutes of face-to-face time during this encounter and > 50% was spent counseling as documented under my assessment and plan.    Mickiel Dry, MD  [facility] [place] - A DEPT OF [facility] [address] [place] [address] Dept:  [phone] Dept Fax: [phone]   No orders of the defined types were placed in this encounter.    ONCOLOGY HISTORY:   Oncology History   No history exists.      Current Treatment:  Tamoxifen   INTERVAL HISTORY:   Katrina Harvey is here today for follow up.   She experiences persistent bruising and soreness in the area affected by radiation therapy, ongoing for a year and a half. These symptoms have been present since the radiation treatment. She has not yet completed a recommended mammogram.  She denies having periods and experiences hot flashes, described as sweating and feeling hot. She is currently taking tamoxifen  without issues and is also taking vitamin D , but not calcium.  Recent labs indicated low potassium levels, which she attributes to sweating while mowing outside. She denies taking diuretics or experiencing diarrhea.  She continues to smoke and has not discussed quitting.  I have reviewed the past medical history, past surgical history, social history and family history with the patient and they are unchanged from previous note.  ALLERGIES:  has no known allergies.  MEDICATIONS:  Current Outpatient Medications  Medication Sig Dispense Refill   ALPRAZolam (XANAX) 0.5 MG tablet Take 0.5 mg by mouth 2 (two) times daily as needed.     Cholecalciferol (VITAMIN D ) 50 MCG (2000 UT) tablet Take 2,000 Units by mouth daily.     escitalopram (LEXAPRO) 10 MG tablet Take 10 mg by mouth daily.     loperamide (IMODIUM) 2 MG capsule Take 2 mg by mouth as needed  for diarrhea or loose stools.     pantoprazole (PROTONIX) 40 MG tablet Take 40 mg by mouth daily.     tamoxifen  (NOLVADEX ) 20 MG tablet take 1 tablet by mouth daily 90 tablet 0   No current facility-administered medications for this visit.    REVIEW OF SYSTEMS:   Constitutional: Denies fevers, chills or abnormal weight loss Eyes: Denies blurriness of vision Ears, nose, mouth, throat, and face: Denies mucositis or  sore throat Respiratory: Denies cough, dyspnea or wheezes Cardiovascular: Denies palpitation, chest discomfort or lower extremity swelling Gastrointestinal:  Denies nausea, heartburn or change in bowel habits Skin: Denies abnormal skin rashes Lymphatics: Denies new lymphadenopathy or easy bruising Neurological:Denies numbness, tingling or new weaknesses Behavioral/Psych: Mood is stable, no new changes  All other systems were reviewed with the patient and are negative.   VITALS:  Blood pressure 102/70, pulse 77, temperature 98 F (36.7 C), temperature source Oral, resp. rate 16, weight 214 lb 1.1 oz (97.1 kg), SpO2 97%.  Wt Readings from Last 3 Encounters:  12/25/23 214 lb 1.1 oz (97.1 kg)  07/14/23 210 lb (95.3 kg)  06/26/23 209 lb (94.8 kg)    Body mass index is 37.92 kg/m.  Performance status (ECOG): 0 - Asymptomatic  PHYSICAL EXAM:   GENERAL:alert, no distress and comfortable EYES: normal, Conjunctiva are pink and non-injected, sclera clear BREAST: Left breast: Normal exam, right breast: Lumpectomy scar healed well, 2 bruises 1 cm X 1 cm below the nipple, no palpable masses or discharge LYMPH:  no palpable lymphadenopathy in the cervical, axillary or inguinal LUNGS: clear to auscultation and percussion with normal breathing effort HEART: regular rate & rhythm and no murmurs and no lower extremity edema ABDOMEN:abdomen soft, non-tender and normal bowel sounds Musculoskeletal:no cyanosis of digits and no clubbing  NEURO: alert & oriented x 3 with fluent speech  LABORATORY DATA:  I have reviewed the data as listed    Component Value Date/Time   NA 136 12/18/2023 1314   K 3.3 (L) 12/18/2023 1314   CL 104 12/18/2023 1314   CO2 23 12/18/2023 1314   GLUCOSE 134 (H) 12/18/2023 1314   BUN 14 12/18/2023 1314   CREATININE 0.86 12/18/2023 1314   CALCIUM 8.7 (L) 12/18/2023 1314   PROT 6.8 12/18/2023 1314   ALBUMIN 3.4 (L) 12/18/2023 1314   AST 26 12/18/2023 1314   ALT 21  12/18/2023 1314   ALKPHOS 92 12/18/2023 1314   BILITOT 0.6 12/18/2023 1314   GFRNONAA >60 12/18/2023 1314    Lab Results  Component Value Date   WBC 6.5 12/18/2023   NEUTROABS 3.7 12/18/2023   HGB 12.5 12/18/2023   HCT 38.7 12/18/2023   MCV 88.0 12/18/2023   PLT 195 12/18/2023      Chemistry      Component Value Date/Time   NA 136 12/18/2023 1314   K 3.3 (L) 12/18/2023 1314   CL 104 12/18/2023 1314   CO2 23 12/18/2023 1314   BUN 14 12/18/2023 1314   CREATININE 0.86 12/18/2023 1314      Component Value Date/Time   CALCIUM 8.7 (L) 12/18/2023 1314   ALKPHOS 92 12/18/2023 1314   AST 26 12/18/2023 1314   ALT 21 12/18/2023 1314   BILITOT 0.6 12/18/2023 1314     Endometrial biopsy: 07/14/2023: FINAL MICROSCOPIC DIAGNOSIS:   A. ENDOMETRIUM, BIOPSY:       Benign inactive endometrium.       Negative for hyperplasia, atypia or malignancy.   RADIOGRAPHIC STUDIES: I have  personally reviewed the radiological images as listed and agreed with the findings in the report.  CT CHEST LUNG CANCER SCREENING LOW DOSE WO CONTRAST CLINICAL DATA:  53 year old female current smoker with 35 pack-year smoking history  EXAM: CT CHEST WITHOUT CONTRAST LOW-DOSE FOR LUNG CANCER SCREENING  TECHNIQUE: Multidetector CT imaging of the chest was performed following the standard protocol without IV contrast.  RADIATION DOSE REDUCTION: This exam was performed according to the departmental dose-optimization program which includes automated exposure control, adjustment of the mA and/or kV according to patient size and/or use of iterative reconstruction technique.  COMPARISON:  12/21/2021 chest radiograph.  FINDINGS: Cardiovascular: Normal heart size. No significant pericardial effusion/thickening. Three-vessel coronary atherosclerosis. Atherosclerotic nonaneurysmal thoracic aorta. Normal caliber pulmonary arteries.  Mediastinum/Nodes: No significant thyroid  nodules. Unremarkable esophagus.  No pathologically enlarged axillary, mediastinal or hilar lymph nodes, noting limited sensitivity for the detection of hilar adenopathy on this noncontrast study.  Lungs/Pleura: No pneumothorax. No pleural effusion. Mild paraseptal and centrilobular emphysema with diffuse bronchial wall thickening. No acute consolidative airspace disease or lung masses. No significant pulmonary nodules.  Upper abdomen: Prominent diffuse hepatic steatosis. Cholecystectomy.  Musculoskeletal: No aggressive appearing focal osseous lesions. Moderate thoracic spondylosis.  IMPRESSION: 1. Lung-RADS 1, negative. Continue annual screening with low-dose chest CT without contrast in 12 months. 2. Three-vessel coronary atherosclerosis. 3. Prominent diffuse hepatic steatosis. 4. Aortic Atherosclerosis (ICD10-I70.0) and Emphysema (ICD10-J43.9).  Electronically Signed   By: Selinda DELENA Blue M.D.   On: 11/10/2023 12:37

## 2023-12-25 NOTE — Assessment & Plan Note (Addendum)
 Patient is a current smoker. No wishing to quit smoking  - Emphasized on risk of smoking including cancer and heart disease

## 2023-12-25 NOTE — Assessment & Plan Note (Addendum)
 Right breast DCIS, ER positive diagnosed in 10/20/2021.  S/p lumpectomy and SLNB on 12/24/2021. Reexcision of close margins on 01/17/2022. Adjuvant radiation to the right breast from 05/05/2022-05/26/2022.  Started on tamoxifen  20 mg daily in November 2023  - Patient tolerating tamoxifen  well.  Continue 20 mg daily - Had breakthrough bleeding in February 2025 and had endometrial biopsy done that was negative for hyperplasia or malignancy - Mammogram due 11/2023.  Advised patient to schedule this and patient wishes to do it at [facility] -Continue vitamin D  and calcium for bone health  -Labs reviewed today.  CBC and CMP WNL.  Will repeat them in 6 months.  Return to clinic in 3 months for physical exam

## 2024-03-21 ENCOUNTER — Other Ambulatory Visit

## 2024-03-25 DIAGNOSIS — I2510 Atherosclerotic heart disease of native coronary artery without angina pectoris: Secondary | ICD-10-CM | POA: Diagnosis not present

## 2024-03-25 DIAGNOSIS — G4730 Sleep apnea, unspecified: Secondary | ICD-10-CM | POA: Diagnosis not present

## 2024-03-25 DIAGNOSIS — F410 Panic disorder [episodic paroxysmal anxiety] without agoraphobia: Secondary | ICD-10-CM | POA: Diagnosis not present

## 2024-03-25 DIAGNOSIS — Z299 Encounter for prophylactic measures, unspecified: Secondary | ICD-10-CM | POA: Diagnosis not present

## 2024-03-27 NOTE — Progress Notes (Deleted)
 [facility] [address], [place] [address]   CLINIC:  Medical Oncology/Hematology  PCP:  Katrina Isles, MD [address] [place] [address] [phone]   REASON FOR VISIT:  Follow-up for right breast DCIS.   PRIOR THERAPY:  - Lumpectomy and SLNB on 12/24/2021 - Reexcision of close margins on 01/17/2022 - Received adjuvant radiation to the right breast from 05/05/2022-05/26/2022   NGS Results: None   CURRENT THERAPY: Tamoxifen   BRIEF ONCOLOGIC HISTORY:  Right breast DCIS, ER positive, diagnosed with biopsy on 10/20/2021.  Right lumpectomy performed on 12/24/2021.  Reexcision of close margin performed on 01/17/2022.  She received adjuvant radiation to the right breast from 05/05/2022 through 05/26/2022.  She was started on tamoxifen  20 mg daily in November 2023.  INTERVAL HISTORY:   Ms. Katrina Harvey, a 53 y.o. female, returns for routine follow-up of her history of right breast DCIS. Katrina Harvey was last seen on 12/25/2023 by Dr. Davonna.  At today's visit, she  reports feeling ***. ***She denies any recent hospitalizations, surgeries, or changes in her  baseline health status.   She reports that the pulling pain in her right armpit has improved somewhat with exercises and massage.  *** ***She does still report some breast soreness around her right lumpectomy scar.  ***She does not have any swelling in her right arm or right breast.  Next month***no new breast lumps or axillary lymphadenopathy. ***No new onset pains, including any new abdominal pain or new headaches.  She continues to take tamoxifen  and is tolerating this fairly well.  *** ***She had some breakthrough bleeding in February 2025, but endometrial biopsy was negative for hyperplasia or malignancy. ***She has not had any breakthrough bleeding since that time. ***She reports mild hot flashes and night sweats, but does not feel that she needs any medication to ameliorate these at this time. ***She  denies any history of DVT/PE, or current symptoms of DVT/PE.  She reports 60***% energy and 100***% appetite.  She is maintaining stable weight at this time.  ASSESSMENT & PLAN:  1.  Right breast DCIS, ER positive: - Mammogram (09/16/2021): BI-RADS Category 0 with possible asymmetry with calcifications in the right breast - Diagnostic mammogram and ultrasound (10/20/2021): 1.7 cm mass with associated calcifications in the right breast at 11 o'clock position - Biopsy right breast 11:00 (10/20/2021): DCIS, moderate to high nuclear grade, solid to cribriform pattern, with comedonecrosis and calcification.  Tumor involves/4 biopsy specimens.  Invasive carcinoma not identified.  ER: 70% positive, PR 2% positive - Right lumpectomy on 12/24/2021, pathology: High-grade DCIS with comedonecrosis, focal microinvasion seen.  Tumor size is 1 mm.  pT1 MI, pN0 closest medial margin 1 mm.  0/3 lymph nodes involved. - Reexcision of close margin (01/17/2022): Benign breast tissue with fibrocystic changes including stromal fibrosis, cystic dilatation of ducts, apocrine metaplasia, adenosis and focal epithelial hyperplasia without atypia.  Negative for carcinoma. - Started Tamoxifen  20 mg daily in November 2023, to be continued through November 2028 for 5 years of treatment.  She is tolerating tamoxifen  fairly well, apart from some mild hot flashes.   Breakthrough bleeding February 2025, endometrial biopsy negative for hyperplasia or malignancy. - Received adjuvant radiation to the right breast from 05/05/2022-05/26/2022 - Most recent mammogram (11/16/2022 via [facility]): BI-RADS Category 2, benign.  Postlumpectomy changes in upper/outer aspect of right breast.  Right breast skin thickening and mild trabecular thickening consistent with radiation-induced edema.  No new or suspicious breast masses, no areas of nonsurgical  architectural distortion, and no suspicious calcifications. - Most recent labs (12/18/2023): Normal CBC.  Normal  LFTs, with CMP unremarkable, no prohibitive toxicities to continuing tamoxifen . - Physical exam (***) reveals areas of dense breast tissue bilaterally.   LEFT BREAST: Within normal limits. RIGHT BREAST: Mild tenderness at right lumpectomy scar in upper/outer quadrant.  No palpable masses or lymphadenopathy.  Mild ecchymosis on the medial aspect of areola (present since lumpectomy per patient report).  Small freely mobile cyst with rounded edges in right breast at 3 o'clock position, about 1 cm from nipple. - No red flag symptoms of recurrence per history/ROS   - PLAN: She is OVERDUE for mammogram.  She declined to schedule this at last appointment, as she preferred this to be done at [facility].  *** - Continue tamoxifen . - RTC 6 months with labs and repeat physical exam  2.  Bone health: - Bone density scan via PCP (08/06/2021): T-score -1.0, normal. - Vitamin D  (12/18/2023): Normal at 31.57.  Normal corrected calcium (9.2). - She is on vitamin D  1000 units daily.*** - PLAN: Continue vitamin D  1000 units daily.    3.  Tobacco abuse - This patient meets criteria for low-dose CT lung cancer screening - currently age 59, smoking 35 years at least one pack per day (35 pack-year history), current every day smoker (1 PPD) - Shared decision making visit (06/26/2023) included risks and benefits of screening, potential for follow-up, diagnostic testing for abnormal scans, potential for false positive tests, overdiagnosis, discussion about total radiation exposure - LDCT chest (10/23/2023): Lung RADS 1, negative. - PLAN: Next LDCT chest due May 2026. - Patient has been referred ***for further resources regarding free nicotine replacement therapy and information about smoking cessation classes ***  4.  Social/family history: - She is currently on disability.  She used to work as a Associate Professor.  Current active smoker, 1 pack/day for 30 years.  No chemical exposure. - Maternal uncle had cancer, type unknown to  the patient.  Maternal cousin has lung cancer and another maternal cousin has breast cancer. - LMP on 11/03/2021.  Menses have gotten irregular once every 2 to 3 months since 2022.  She denies any menstrual cycle since starting tamoxifen .   PLAN SUMMARY: >> Overdue for mammogram *** [facility]?  *** >> Labs in 6 months = CBC/D, CMP, vitamin D  >> OFFICE visit in 6 months    REVIEW OF SYSTEMS: ***  Review of Systems  Constitutional:  Positive for fatigue. Negative for appetite change, chills, diaphoresis, fever and unexpected weight change.  HENT:   Negative for lump/mass and nosebleeds.   Eyes:  Negative for eye problems.  Respiratory:  Negative for cough, hemoptysis and shortness of breath.   Cardiovascular:  Negative for chest pain, leg swelling and palpitations.  Gastrointestinal:  Negative for abdominal pain, blood in stool, constipation, diarrhea, nausea and vomiting.  Genitourinary:  Negative for hematuria.   Musculoskeletal:  Negative for myalgias.  Skin: Negative.   Neurological:  Negative for dizziness, headaches and light-headedness.  Hematological:  Does not bruise/bleed easily.    PHYSICAL EXAM: ***  Performance status (ECOG): 1 - Symptomatic but completely ambulatory  There were no vitals filed for this visit.  Wt Readings from Last 3 Encounters:  12/25/23 214 lb 1.1 oz (97.1 kg)  07/14/23 210 lb (95.3 kg)  06/26/23 209 lb (94.8 kg)   Physical Exam Constitutional:      Appearance: Normal appearance. She is obese.  Cardiovascular:     Heart  sounds: Normal heart sounds.  Pulmonary:     Breath sounds: Normal breath sounds.  Chest:  Breasts:    Right: Tenderness present.       Comments: Areas of dense breast tissue bilaterally.   >> LEFT BREAST: Within normal limits. >> RIGHT BREAST: Mild tenderness at right lumpectomy scar in upper/outer quadrant.  No palpable masses or lymphadenopathy.  Mild ecchymosis on the medial aspect of areola (present since lumpectomy per  patient report).  Small freely mobile cyst with rounded edges in right breast at 3 o'clock position, about 1 cm from nipple. Lymphadenopathy:     Upper Body:     Right upper body: No supraclavicular, axillary or pectoral adenopathy.     Left upper body: No supraclavicular, axillary or pectoral adenopathy.  Neurological:     General: No focal deficit present.     Mental Status: Mental status is at baseline.  Psychiatric:        Behavior: Behavior normal. Behavior is cooperative.     PAST MEDICAL/SURGICAL HISTORY:  Past Medical History:  Diagnosis Date   AVM (arteriovenous malformation)    Breast cancer (HCC)    Cancer (HCC)    breast cancer   Microscopic colitis    Past Surgical History:  Procedure Laterality Date   BREAST LUMPECTOMY Right 01/17/2022   partial mastectomy   CHOLECYSTECTOMY     CRANIOTOMY FOR AVM  2013   MASTECTOMY, PARTIAL Right 01/17/2022   Procedure: MASTECTOMY PARTIAL;  Surgeon: Mavis Anes, MD;  Location: [facility];  Service: General;  Laterality: Right;   PARTIAL MASTECTOMY WITH AXILLARY SENTINEL LYMPH NODE BIOPSY Right 12/24/2021   Procedure: PARTIAL MASTECTOMY WITH AXILLARY SENTINEL LYMPH NODE WITH RADIO FREQUENCY TAG PLACEMENT;  Surgeon: Mavis Anes, MD;  Location: [facility];  Service: General;  Laterality: Right;    SOCIAL HISTORY:  Social History   Socioeconomic History   Marital status: Married    Spouse name: Not on file   Number of children: Not on file   Years of education: Not on file   Highest education level: Not on file  Occupational History   Not on file  Tobacco Use   Smoking status: Every Day    Types: Cigarettes   Smokeless tobacco: Never  Substance and Sexual Activity   Alcohol  use: Not Currently   Drug use: Never   Sexual activity: Not on file  Other Topics Concern   Not on file  Social History Narrative   Not on file   Social Drivers of Health   Financial Resource Strain: Not on file  Food Insecurity: Not on file   Transportation Needs: Not on file  Physical Activity: Not on file  Stress: Not on file  Social Connections: Not on file  Intimate Partner Violence: Not on file    FAMILY HISTORY:  Family History  Problem Relation Age of Onset   Breast cancer Neg Hx     CURRENT MEDICATIONS:  Current Outpatient Medications  Medication Sig Dispense Refill   ALPRAZolam (XANAX) 0.5 MG tablet Take 0.5 mg by mouth 2 (two) times daily as needed.     Cholecalciferol (VITAMIN D ) 50 MCG (2000 UT) tablet Take 2,000 Units by mouth daily.     escitalopram (LEXAPRO) 10 MG tablet Take 10 mg by mouth daily.     loperamide (IMODIUM) 2 MG capsule Take 2 mg by mouth as needed for diarrhea or loose stools.     pantoprazole (PROTONIX) 40 MG tablet Take 40 mg by mouth daily.  tamoxifen  (NOLVADEX ) 20 MG tablet take 1 tablet by mouth daily 90 tablet 0   No current facility-administered medications for this visit.    ALLERGIES:  No Known Allergies  LABORATORY DATA:  I have reviewed the labs as listed.     Latest Ref Rng & Units 12/18/2023    1:14 PM 06/19/2023   11:04 AM 12/19/2022   12:42 PM  CBC  WBC 4.0 - 10.5 K/uL 6.5  6.4  5.8   Hemoglobin 12.0 - 15.0 g/dL 87.4  86.3  86.3   Hematocrit 36.0 - 46.0 % 38.7  43.3  40.8   Platelets 150 - 400 K/uL 195  194  195       Latest Ref Rng & Units 12/18/2023    1:14 PM 06/19/2023   11:04 AM 12/19/2022   12:42 PM  CMP  Glucose 70 - 99 mg/dL 865  869  870   BUN 6 - 20 mg/dL 14  19  12    Creatinine 0.44 - 1.00 mg/dL 9.13  9.37  9.25   Sodium 135 - 145 mmol/L 136  135  134   Potassium 3.5 - 5.1 mmol/L 3.3  4.2  3.9   Chloride 98 - 111 mmol/L 104  104  104   CO2 22 - 32 mmol/L 23  25  23    Calcium 8.9 - 10.3 mg/dL 8.7  9.3  9.0   Total Protein 6.5 - 8.1 g/dL 6.8  7.4  7.2   Total Bilirubin 0.0 - 1.2 mg/dL 0.6  0.4  0.5   Alkaline Phos 38 - 126 U/L 92  100  85   AST 15 - 41 U/L 26  27  25    ALT 0 - 44 U/L 21  30  23      DIAGNOSTIC IMAGING:  I have  independently reviewed the scans and discussed with the patient. No results found.   WRAP UP:  All questions were answered. The patient knows to call the clinic with any problems, questions or concerns.  Medical decision making: Moderate  Time spent on visit: I spent 20 minutes counseling the patient face to face. The total time spent in the appointment was 30 minutes and more than 50% was on counseling.  Pleasant CHRISTELLA Barefoot, PA-C  ***

## 2024-03-28 ENCOUNTER — Inpatient Hospital Stay: Admitting: Physician Assistant

## 2024-04-18 ENCOUNTER — Other Ambulatory Visit: Payer: Self-pay | Admitting: Physician Assistant

## 2024-04-18 NOTE — Telephone Encounter (Signed)
 Patient tolerating Tamoxifen  and is to continue therapy per OVN.

## 2024-04-19 DIAGNOSIS — E2839 Other primary ovarian failure: Secondary | ICD-10-CM | POA: Diagnosis not present

## 2024-04-22 DIAGNOSIS — Z08 Encounter for follow-up examination after completed treatment for malignant neoplasm: Secondary | ICD-10-CM | POA: Diagnosis not present

## 2024-04-22 DIAGNOSIS — Z853 Personal history of malignant neoplasm of breast: Secondary | ICD-10-CM | POA: Diagnosis not present

## 2024-06-03 ENCOUNTER — Encounter: Payer: Self-pay | Admitting: *Deleted

## 2024-06-14 ENCOUNTER — Other Ambulatory Visit: Payer: Self-pay

## 2024-06-14 DIAGNOSIS — F17210 Nicotine dependence, cigarettes, uncomplicated: Secondary | ICD-10-CM

## 2024-06-14 DIAGNOSIS — Z87891 Personal history of nicotine dependence: Secondary | ICD-10-CM

## 2024-06-14 DIAGNOSIS — Z122 Encounter for screening for malignant neoplasm of respiratory organs: Secondary | ICD-10-CM
